# Patient Record
Sex: Male | Born: 1965
Health system: Southern US, Community
[De-identification: ages and names within clinical notes are randomized; demographics above are authoritative.]

## PROBLEM LIST (undated history)

## (undated) DIAGNOSIS — K219 Gastro-esophageal reflux disease without esophagitis: Secondary | ICD-10-CM

## (undated) DIAGNOSIS — M1990 Unspecified osteoarthritis, unspecified site: Secondary | ICD-10-CM

## (undated) DIAGNOSIS — I10 Essential (primary) hypertension: Secondary | ICD-10-CM

## (undated) DIAGNOSIS — F419 Anxiety disorder, unspecified: Secondary | ICD-10-CM

## (undated) DIAGNOSIS — E039 Hypothyroidism, unspecified: Secondary | ICD-10-CM

## (undated) DIAGNOSIS — F319 Bipolar disorder, unspecified: Secondary | ICD-10-CM

## (undated) DIAGNOSIS — Z87442 Personal history of urinary calculi: Secondary | ICD-10-CM

## (undated) DIAGNOSIS — E7800 Pure hypercholesterolemia, unspecified: Secondary | ICD-10-CM

## (undated) HISTORY — PX: ROTATOR CUFF REPAIR: SHX139

## (undated) HISTORY — PX: ANTERIOR CERVICAL DECOMP/DISCECTOMY FUSION: SHX1161

## (undated) HISTORY — PX: TRIGGER FINGER RELEASE: SHX641

## (undated) HISTORY — PX: EYE SURGERY: SHX253

---

## 2000-07-21 ENCOUNTER — Inpatient Hospital Stay (HOSPITAL_COMMUNITY): Admission: EM | Admit: 2000-07-21 | Discharge: 2000-07-26 | Payer: Self-pay | Admitting: Psychiatry

## 2000-09-24 ENCOUNTER — Encounter: Payer: Self-pay | Admitting: Urology

## 2000-09-24 ENCOUNTER — Ambulatory Visit (HOSPITAL_COMMUNITY): Admission: RE | Admit: 2000-09-24 | Discharge: 2000-09-24 | Payer: Self-pay | Admitting: Urology

## 2000-09-29 ENCOUNTER — Encounter: Payer: Self-pay | Admitting: Urology

## 2000-09-29 ENCOUNTER — Ambulatory Visit (HOSPITAL_COMMUNITY): Admission: RE | Admit: 2000-09-29 | Discharge: 2000-09-29 | Payer: Self-pay | Admitting: Urology

## 2004-02-01 ENCOUNTER — Inpatient Hospital Stay (HOSPITAL_COMMUNITY): Admission: EM | Admit: 2004-02-01 | Discharge: 2004-02-10 | Payer: Self-pay | Admitting: Psychiatry

## 2004-02-01 ENCOUNTER — Ambulatory Visit: Payer: Self-pay | Admitting: Psychiatry

## 2004-03-23 ENCOUNTER — Ambulatory Visit: Payer: Self-pay | Admitting: Psychiatry

## 2004-03-23 ENCOUNTER — Inpatient Hospital Stay (HOSPITAL_COMMUNITY): Admission: RE | Admit: 2004-03-23 | Discharge: 2004-04-02 | Payer: Self-pay | Admitting: Psychiatry

## 2004-03-23 ENCOUNTER — Encounter: Payer: Self-pay | Admitting: Emergency Medicine

## 2004-07-02 ENCOUNTER — Ambulatory Visit: Payer: Self-pay | Admitting: Psychiatry

## 2004-07-02 ENCOUNTER — Inpatient Hospital Stay (HOSPITAL_COMMUNITY): Admission: RE | Admit: 2004-07-02 | Discharge: 2004-07-09 | Payer: Self-pay | Admitting: Psychiatry

## 2004-10-27 ENCOUNTER — Emergency Department (HOSPITAL_COMMUNITY): Admission: EM | Admit: 2004-10-27 | Discharge: 2004-10-27 | Payer: Self-pay | Admitting: Emergency Medicine

## 2007-09-23 ENCOUNTER — Ambulatory Visit (HOSPITAL_COMMUNITY): Admission: RE | Admit: 2007-09-23 | Discharge: 2007-09-24 | Payer: Self-pay | Admitting: Neurosurgery

## 2008-02-03 ENCOUNTER — Ambulatory Visit (HOSPITAL_COMMUNITY): Admission: RE | Admit: 2008-02-03 | Discharge: 2008-02-03 | Payer: Self-pay | Admitting: Neurosurgery

## 2008-04-29 ENCOUNTER — Ambulatory Visit (HOSPITAL_COMMUNITY): Admission: RE | Admit: 2008-04-29 | Discharge: 2008-04-29 | Payer: Self-pay | Admitting: Neurosurgery

## 2008-06-28 ENCOUNTER — Inpatient Hospital Stay (HOSPITAL_COMMUNITY): Admission: RE | Admit: 2008-06-28 | Discharge: 2008-06-30 | Payer: Self-pay | Admitting: Neurosurgery

## 2010-06-27 LAB — BASIC METABOLIC PANEL
CO2: 27 mEq/L (ref 19–32)
Calcium: 9.7 mg/dL (ref 8.4–10.5)
Creatinine, Ser: 0.83 mg/dL (ref 0.4–1.5)
GFR calc Af Amer: >60 mL/min (ref >60)
GFR calc non Af Amer: >60 mL/min (ref >60)
Glucose, Bld: 99 mg/dL (ref 70–99)
Sodium: 137 mEq/L (ref 135–145)

## 2010-06-27 LAB — CBC
Hemoglobin: 14.9 g/dL (ref 13.0–17.0)
MCHC: 34.5 g/dL (ref 30.0–36.0)
RDW: 13.6 % (ref 11.5–15.5)

## 2010-06-27 LAB — NICOTINE/COTININE METABOLITES

## 2010-07-31 NOTE — Op Note (Signed)
NAME:  Joshua Barr, Joshua Barr             ACCOUNT NO.:  [id number]   MEDICAL RECORD NO.:  [id number]          PATIENT TYPE:  OIB   LOCATION:  3536                         FACILITY:  [facility]   PHYSICIAN:  Hilda Lias, M.D.   DATE OF BIRTH:  [birth year]   DATE OF PROCEDURE:  09/23/2007  DATE OF DISCHARGE:                               OPERATIVE REPORT   PREOPERATIVE DIAGNOSIS:  C6-C7 herniated disk with acute radiculopathy,  left.   POSTOPERATIVE DIAGNOSIS:  C6-C7 herniated disk with acute radiculopathy,  left.   PROCEDURE:  Anterior C6-C7 diskectomy, decompression of the spinal cord,  removal of large fragment in the left side, interbody fusion with  allograft and autograft plate, and microscopy.   SURGEON:  Hilda Lias, MD   ASSISTANT:  Coletta Memos   CLINICAL HISTORY:  The patient was seen by me a few days ago in my  office because of neck pain with radiation down to the left upper  extremity associated with weakness.  MRI showed a large herniated disk  at the level of C6-C7.  He failed with conservative treatment.  Surgery  was advised.   COURSE IN THE HOSPITAL:  The patient was taken to the OR, and after  intubation, the left side of the neck was cleaned with DuraPrep.  Transverse incision was made through the skin, subcutaneous tissue, and  platysma down to the cervical spine.  X-rays showed that we were at the  level of C5-C6.  From then on, we dropped one step down below, and with  the microscope, we opened the anterior ligament.  The C6-C7 disk was  removed.  Immediately, we opened the posterior ligament, and to the  right side, there was some herniated disk, but to the left side, we  found there were around 3-4 fragments compromising the C7 nerve root.  At the end, we had a good decompression of the spinal cord as well as  both C7 nerve roots.  Pieces of bone from the C6-C7 vertebral wall were  used for autograft.  Then, an allograft of 8 mm with autograft in the  middle  was inserted followed by a plate using 4 screws.  Because of the  shoulders, we were able to see the upper part of the screws, which were  in good position.  We waited 5 minutes just to be sure that we achieved  good hemostasis, and once this was done, the wound was closed with  Vicryl and Steri-Strips.           ______________________________  Hilda Lias, M.D.     EB/MEDQ  D:  09/23/2007  T:  09/24/2007  Job:  [id number]
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
## 2010-07-31 NOTE — Op Note (Signed)
NAME:  Joshua Barr, Joshua Barr             ACCOUNT NO.:  [id number]   MEDICAL RECORD NO.:  [id number]          PATIENT TYPE:  OIB   LOCATION:  3005                         FACILITY:  [facility]   PHYSICIAN:  Hilda Lias, M.D.   DATE OF BIRTH:  January 24, 1966   DATE OF PROCEDURE:  06/28/2008  DATE OF DISCHARGE:                               OPERATIVE REPORT   PREOPERATIVE DIAGNOSIS:  C6-C7 pseudoarthrosis.   POSTOPERATIVE DIAGNOSIS:  C6-C7 pseudoarthrosis.   PROCEDURE:  Removal of the previous C6-C7 plate, removal of the  pseudoarthrosis C6-C7, iliac crest bone graft, left interbody fusion  with the iliac crest bone graft, new plate, microscope.   SURGEON:  Hilda Lias, MD   ASSISTANT:  Clydene Fake, MD   CLINICAL HISTORY:  [name] is a gentleman who has had lateral  diverge to the disk at the C6-C7 several months ago.  Pain-wise, he did  well but then he developed neck pain which was worse with activity.  He  failed with the Pain Clinic.  X-rays showed that he has developed  pseudoarthrosis at C6-C7.  I talked to him and his family and at the end  he agreed to stop smoking before we went ahead and proceed with a new  fusion.  The risks were explained to him in the history and physical.   PROCEDURE IN DETAIL:  The patient was taken to the OR.  After  intubation, the neck on the left side was cleaned with DuraPrep.  Transverse incision following the previous wound was made through the  subcutaneous tissue, platysma down into the cervical area.  The patient  had quite a bit of adhesion and lysis was accomplished.  At the end, we  were able to see the plate.  Scar tissue was removed.  The plate was  removed easily.  Then with the microscope and the help of the  microcurette as well as the drill, we removed the osteoarthrosis in  between C6-C7 until we were able to find a normal vertebral body of C6-  C7.  Because he has no pain in the arms, foraminotomy was attemped.  There was not  anything compromising the nerve root.  Then an incision on  the left hip through the skin and subcutaneous tissue toward the thick  adipose tissue down to the fascia was done.  Then a piece of bone graft  of approximately 8 mm height and thin lens was removed.  After we  removed this, the hemostasis of the donor site was done with a Gelfoam.  The edges of the area were smoothed with the rongeur.  Then the wound  was closed with Vicryl and Steri-Strips.  At the neck, We were able to  tailor the bone graft and to fill out the cavity.  It was done without a  problem.  This was followed by a new plate using four screws.  We  attempted to do a left paracervical spine but because of the shoulder we  were only to see only the Doppler  C5.  The area was removed.  The area  was irrigated and although  we achieved good hemostasis and the drain was  left.  Then the wound was closed with Vicryl and Steri-Strips.           ______________________________  Hilda Lias, M.D.    EB/MEDQ  D:  06/28/2008  T:  06/29/2008  Job:  [id number]
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
## 2010-08-03 NOTE — H&P (Signed)
[facility]  Patient:    Joshua Barr, Joshua Barr                      MRN: [id number] Adm. Date:  [id number] Attending:  Rachael Fee Dictator:   Young Berry Lorin Picket, R.N., F.N.P.                   Psychiatric Admission Assessment  DATE OF ADMISSION:  Jul 21, 2000  PATIENT IDENTIFICATION:  This is a 45 year old separated Caucasian male voluntary admission for suicidal ideation with thoughts of shooting himself but no plan.  HISTORY OF PRESENT ILLNESS:  Today, the patient reports increased depression for approximately two months since his wife left him.  She walked out on him after six months of marriage and he has had no communication with her since that time.  She left him with a fair amount of bills and some financial stress. The patient cites symptoms of increased sadness and loneliness, feels worthless and a failure.  He is having difficulty concentrating on the job but states that his performance is okay.  States he becomes more depressed being home alone in an empty house.  He denies any suicidal ideation today.  He denies that he had ever had a plan or any real intent to harm himself but was having recurrent thoughts over the past couple of months that he would be better off dead.  He reports that he does own a gun but had not followed through with any kinds of plans to harm himself.  Although he does have suicidal ideation today, he continues to feel extremely depressed.  He denies homicidal ideation.  He does admit to having some auditory hallucinations over the past couple of weeks, thinking that he hears somebody call his name but when he turns, there is no one there.  He is not having any auditory hallucinations today.  He has had no visual hallucinations, denies any paranoia.  The patient reports no change in appetite but has gained approximately 60 pounds in the last six months.  PAST PSYCHIATRIC HISTORY:  The patient has been followed by  [facility] for approximately three years.  Anice Paganini is his counselor there and Robin, the nurse practitioner, follows him for his prescription medications.  He was diagnosed with bipolar disorder approximately four to five years ago with a history when his mood is elevated he is alternately happy and then very irritable and angry, seems to have mood swings only more when his mood is elevated.  Inpatient history includes admission to [facility] approximately five years ago and to [facility] one time earlier for detoxifying off marijuana.  The patient previously has been treated with Depakote, Trileptal, Paxil, and lithium with poor results.  He felt that he became resistant to lithium and feels that Trileptal and Depakote just did not work very well for him in the past.  SUBSTANCE ABUSE HISTORY:  The patient reports drinking three beers every one to two days and marijuana once every two to three days.  Denies any use of cocaine or other illegal drugs.  PAST MEDICAL HISTORY:  The patient is followed by Dr. Olena Leatherwood, primary care Jeri Rawlins in [facility]; last seen two weeks ago for a sinus infection treated with Biaxin.  The patient also gives a history of GERD, which has been untreated, and a history of seasonal allergies for which he has taken Allegra intermittently in the past.  The patient also states  that his blood pressure has been elevated at Dr. Olena Leatherwood, which Dr. Olena Leatherwood has chosen not to treat but has attributed to anxiety.  Medications: Zyprexa 10 mg q.a.m. and 15 mg q.p.m., Prozac 20 mg p.o. q.d., trazodone 100 mg p.o. q.h.s., and Allegra p.r.n. for allergies.  Drug allergies: No known drug allergies.  Please see physical examination done in the [facility] Emergency Room, which we will request a copy of.  It was noted that his UDS was positive for cannabinoids but otherwise negative.  Vital signs on admission were temperature 98.1, pulse  80, respirations 20, blood pressure 159/95 sitting and 153/100 standing.  The patient states that his baseline weight is 165 pounds. He is 5 feet 10.5 inches tall and weighs approximately 229 pounds.  Other labs showed BUN 10 and creatinine 1.0.  We will get a complete CBC and routine UA.   SOCIAL HISTORY:  The patient was raised in [place], now lives in [place], [place].  He has his GED.  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  He has married x 3.  His last marriage was for six months and his wife left when she got her tax money and left him with the bills.  His mother and stepfather are supportive.  He is estranged from his father who has a history of alcohol abuse.  Family history is positive for father with a history of alcohol abuse and one uncle with bipolar illness.  MENTAL STATUS EXAMINATION:  Casually dressed white male with blunted affect, neatly groomed.  Pleasant, polite, and cooperative.  Speech is normal in pace and tone and is relevant.  Mood is depressed.  Thought processes are logical and coherent.  Negative for suicidal ideation today although he has had suicidal thoughts all week intermittently; no evidence of homicidal ideation, no auditory or visual hallucinations at this time, no evidence of paranoia or delusions.  Cognitive: Oriented x 3 and intact.  ADMISSION DIAGNOSES: Axis I:    Bipolar disorder currently depressed with psychotic features. Axis II:   Deferred. Axis III:  1. Gastroesophageal reflux disease by history.            2. Seasonal allergies.            3. Elevated blood pressure. Axis IV:   Moderate problems with the primary support group with change in            marital status. Axis V:    Current is 35, past year is 56.  INITIAL PLAN OF CARE:  Admit the patient to stabilize his mood.  Q.10m. checks are in place.  We will get the physical examination and the emergency department records from [facility].  Problem 1.  Bipolar disorder, depressed.  We will plan on discontinuing his Zyprexa and we will start him on Seroquel 50 mg p.o. q.h.s.  We will complete his routine labs and then as soon as we are satisfied, will start him on  Lithobid 300 mg b.i.d. and we will check his level again on Thursday, May 9. We are going to discontinue his trazodone and we will give him Ambien 10 mg p.r.n. for sleep.  We will also provide him with Ativan 0.5 mg p.r.n. for anxiety.  We are going to discontinue his Prozac and we will start him on Zoloft 50 mg a day; however a starter dose of Zoloft 25 mg at lunch time today since he has already had his Prozac.  Problem 2. Elevated  blood pressure.  This is possibly from anxiety and we will continue to monitor it for now on a b.i.d. basis.  Problem 3. Seasonal allergies.  We will start him on Allegra 60 mg p.o. q.d. p.r.n.  ESTIMATED LENGTH OF STAY:  Three to five days. DD:  07/22/00 TD:  07/22/00 Job: [id number]

## 2010-08-03 NOTE — H&P (Signed)
NAME:  Joshua Barr, Joshua Barr NO.:  [id number]   MEDICAL RECORD NO.:  [id number]          PATIENT TYPE:  IPS   LOCATION:  0505                          [facility]ACILITY[facility]  [facility]   PHYSICIAN:  Jeanice Lim, M.D. DATE OF BIRTH:  01-26-1966   DATE OF ADMISSION:  03/23/2004  DATE OF DISCHARGE:                         PSYCHIATRIC ADMISSION ASSESSMENT   This is a 45 year old married white male.  Apparently he presented to the  [facility] emergency room last night reports suicidal and homicidal  ideation, mood swings and a lot of rage.  He was also complaining of right  pain.  He stomped his foot a month ago.  His urine drug screen was positive  for marijuana, cocaine, and benzodiazepines.  He is not prescribed any  benzodiazepines, according to our records and according to the information  he gave the intake person.  When asked about this, he stated that a Dr.  Olena Leatherwood up in [place] had prescribed him some alprazolam a few weeks and that  is why he had benzodiazepines in his urine.  Today, he states that he just  felt depressed and the urge to re-use cocaine became overwhelming.  He could  not resist it, and hence, he relapsed.   PAST PSYCHIATRIC HISTORY:  He was last here as an inpatient November 16 to  November 25.  He was hospitalized at [facility] in 2002.  He has  been to [facility].  He has had other rehab programs.  He is currently followed in  [facility] by Dr. Duayne Cal at [facility].   SOCIAL HISTORY:  He received a high school diploma through the mail.  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  He has been unemployed  the past 2 years.  He is married with no children.  He gets $25.00 a week  from his mother.  His wife is employed as a Comptroller.  She sits with old  people.   FAMILY HISTORY:  He states he has 2 cousins and an uncle who drink alcohol  and are bipolar.   ALCOHOL AND DRUG HISTORY:  He started using at age 47.  He began smoking  marijuana at age 16.  He states his alcohol use began at age 23, and  cocaine, he is unsure, but he uses several time a week.   MEDICAL HISTORY:  Primary care physician is Dr. Olena Leatherwood in [place].  He is  followed for hypothyroidism and osteoarthritis.   MEDICATIONS:  He states that he was prescribed:  1.  Trazodone 250 mg a night.  2.  Tegretol 4 a day, the milligram size is unknown; it is probably 200.  3.  Neurontin, again ? on the milligrams, 6 a day.  4.  Risperdal 0.5 mg 7 per day.  5.  Levothyroxine 75 mcg per day.  6.  Protonix 100 mg one per day.  7.  Mobic 10 mg one per day.   ALLERGIES:  NO KNOWN DRUG ALLERGIES.   PHYSICAL EXAMINATION:  Unremarkable.  He was cleared through the emergency  room.   MENTAL STATUS EXAMINATION:  He  is alert and oriented x3.  He is  appropriately groomed and dressed.  His speech is not pressured.  His motor  and gait are normal.  He has good eye contact.  Mood: He says he feels  worthless and paranoid.  His affect is appropriate.  Thought processes are  clear, rational and goal-oriented.  He states he wants to get better.  There  were no delusions noted.  He states he is paranoid.  He states he feels  fearful of others.  This was not observed.  Judgment and insight are fair.  Concentration and memory are intact.  Intelligence is at least average.  Today, he states he is not feeling suicidal or homicidal.  However, he  acknowledges that he still hears mumbling and hears his name being called at  times.   DIAGNOSES:   AXIS I:  1.  Polysubstance abuse.  2.  Substance abuse induced mood disorder, bipolar type with psychotic      features.  3.  Auditory hallucinations.   AXIS II:  Rule out narcissistic personality disorder.   AXIS III:  1.  Gastroesophageal reflux disease.  2.  Hypothyroidism.  3.  Osteoarthritis.   AXIS IV:  Economic problem, housing problem, problems related to the legal  system.  He had charges brought against him  regarding possession of cocaine.  They will end in a few months if he is clean.  Needless to say, he has not  been clean.   AXIS V:  Global assessment of functioning 30.   PLAN:  Admit for safety and stabilization, to adjust his medications as  indicated, and to review his association with NA and make sure he has a  sponsor.      MD/MEDQ  D:  03/24/2004  T:  03/24/2004  Job:  [id number]

## 2010-08-03 NOTE — Discharge Summary (Signed)
[facility]  Patient:    Joshua Barr, Joshua Barr                    MRN: [id number] Adm. Date:  [id number] Disc. Date: [id number] Attending:  Denny Peon Dictator:   Landry Corporal, N.P.                           Discharge Summary  The patient was denying any auditory hallucinations or visual hallucinations, denying any paranoia.  He does report some change in his appetite to where he has gained approximately 60 pounds in the last 6 months.  Patient is followed by [facility] for approximately last 3 years.  He sees Otilio Miu, Nurse Practitioner, and Anice Paganini as his counselor.  Patient was diagnosed with bipolar disorder approximately 4 to 5 years ago.  PAST MEDICAL HISTORY:  The patient is followed by Dr. Gaspar Skeeters, primary care [name], [facility].  He was last seen a couple of weeks ago for a sinus infection.  The patient also has a history of GERD, and a history of seasonal allergies which he takes Allegra for. DD:  08/15/00 TD:  08/15/00 Job: [id number]

## 2010-08-03 NOTE — Discharge Summary (Signed)
[facility]  Patient:    Joshua Barr, Joshua Barr                    MRN: [id number] Adm. Date:  [id number] Disc. Date: [id number] Attending:  Denny Peon Dictator:   Candi Leash. Orsini, N.P.                           Discharge Summary  HISTORY OF PRESENT ILLNESS:  This is a 45 year old separated Caucasian male that was voluntarily admitted for suicidal ideation with thoughts of shooting himself with no intent.  The patient reported increased depression for approximately two months since his wife had left him.  The patient was also experiencing some financial stress.  He was having symptoms of depression, feeling very sad and lonely, worthless and a failure.  He was having difficulty concentrating on his job, although he felt his job performance was adequate. DD:  08/15/00 TD:  08/15/00 Job: [id number]
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
## 2010-08-03 NOTE — Discharge Summary (Signed)
[facility]  Patient:    Joshua Barr, Joshua Barr                    MRN: [id number] Adm. Date:  [id number] Disc. Date: [id number] Attending:  Denny Peon Dictator:   Landry Corporal, N.P.                           Discharge Summary  CURRENT MEDICATIONS: 1. Zyprexa 10 mg q.a.m. and 15 mg q.p.m. 2. Prozac 20 mg p.o. q.d. 3. Trazodone 100 mg p.o. q.h.s. 4. Allegra p.r.n. for allergies.  DRUG ALLERGIES:  No known drug allergies.  PHYSICAL EXAMINATION:  Performed at emergency room.  Urine drug screen was positive for cannabis.  MENTAL STATUS EXAMINATION:  He is a casually dressed white male with blunted affect.  Neatly groomed.  Pleasant, polite, and cooperative.  Speech is normal in pace and tone, and is relevant.  Mood is depressed.  Thought processes are logical and coherent. Negative for suicidal ideation, although he has had suicidal thoughts all week intermittently with no evidence of homicidal ideation.  No auditory or visual hallucinations or evidence of paranoia or delusions.  Cognitively, he is oriented x 3 and is intact.  ADMITTING DIAGNOSIS: Axis I:     Bipolar disorder, currently depressed, with psychotic features. Axis II:    Deferred. Axis III:   1. Gastroesophageal reflux disease by history.             2. Seasonal allergies.             3. Elevated blood pressure. Axis IV:    Moderate problems relating to primary support group, with change             in marital status. Axis V:     Current is 30, this past year 65.  HOSPITAL COURSE:  The patient was admitted to stabilize his mood.  He will be monitored every 15 minutes.  We will discontinue his Zyprexa and start him on Seroquel q.h.s.  Will complete his routine labs.  Will also discontinue his trazodone and start him on Ambien 10 mg for sleep, and have Ativan available on a p.r.n. basis for anxiety.  We will monitor his blood pressure and continue him on his Allegra for his seasonal  allergies.  Patient initially was doing well.  He was improving.  He was sleeping better, without having any hallucinations.  He was having a decrease in racing thoughts.  He was tolerating his medications well. We will continue him on his lithium and look at a short hospital stay.  Patient, however, had some return of thoughts or voices  and recurring anxiety and anger.  He was sleeping little and continuing with his racing thoughts.  His affect remained blunted.  We increased his Risperdal and increased his Zoloft.  He was still having some problems with sleep.  We decreased his trazodone and increased his Seroquel.  CONDITION ON DISCHARGE:  The client was much better.  He was calmer and there were no signs of psychosis.  He was promising safety.  He was no longer discussing any suicidal ideation.  There appeared to be good support with family.  It was felt that patient could be managed on outpatient basis.  DISPOSITION:  Patient was discharged to home.  FOLLOW UP:  He was to follow up with [facility].  An appointment and phone number was provided.  He was  to see Brock Bad.  DISCHARGE MEDICATIONS: 1. Zoloft 50 mg 1-1/2 tabs every day, 75 mg total. 2. Risperdal 0.25 mg one at 8, 12, and 4. 3. Seroquel 25 mg 3 at bedtime. 4. Lithium carbonate 1 in the morning and 2 at night. 5. Trazodone 50 mg 1 at bedtime as needed for sleep. 6. Claritin 10 mg 1 q.d. for allergies as needed.  DISCHARGE DIAGNOSES: Axis I:    Bipolar disorder, currently depressed, with psychotic features. Axis II:   Deferred. Axis III:  1. Gastroesophageal reflux disease.            2. Seasonal allergies.            3. Elevated blood pressure. Axis IV:   Moderate psychosocial stressors. Axis V:    Current is 73, this past year 39. DD:  08/15/00 TD:  08/15/00 Job: [id number]
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
## 2010-11-04 IMAGING — CT CT CERVICAL SPINE W/ CM
3 of 6 series · 9 of 30 positions shown, 10 images · non-contrast
Comparison: None.
COMPARISON: None.

CLINICAL DATA: Left arm and left hand numbness and tingling.

MYELOGRAM CERVICAL
TECHNIQUE: Contrast was injected by Dr.Fluker at the at what
appears to be the L4-5  level.  Contrast was negotiated into the
cervical spine without difficulty.
TECHNIQUE: Multidetector CT imaging of the cervical spine was
performed following myelography.  Multiplanar CT image
reconstructions were also generated.

[Series 2: cervical spine · axial · 0.27mm/px · z∈[-282,-175]mm · 3 of 87 slices shown, 4 images]
[im 22/87  soft-tissue]
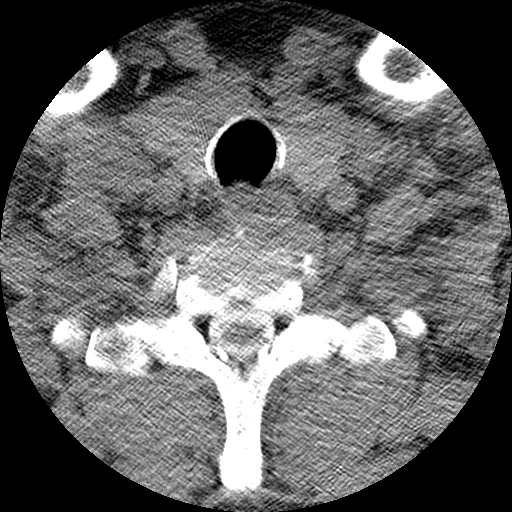
[im 22/87  bone]
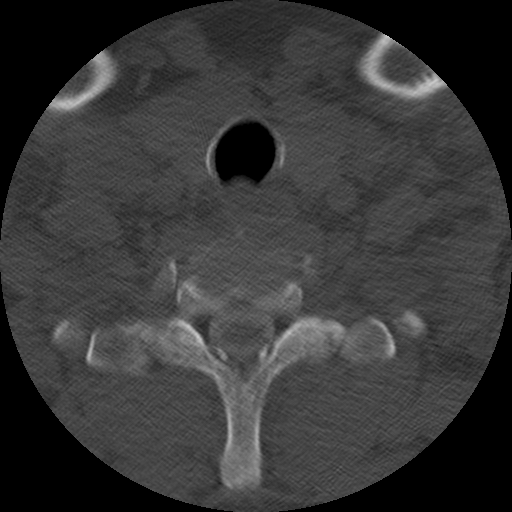
[im 44/87  bone]
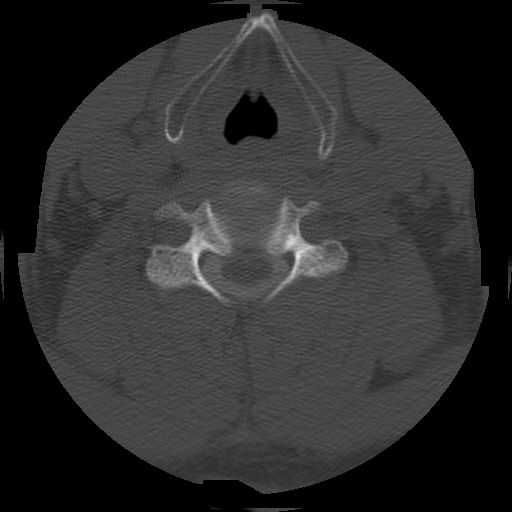
[im 65/87  bone]
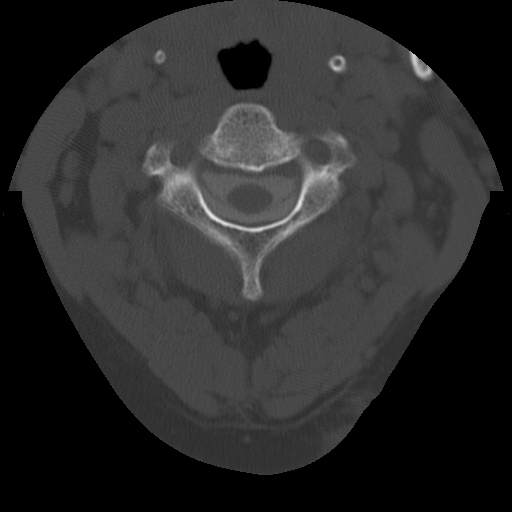

[Series 3: recon 2: cervical spine · axial · 0.25mm/px · z∈[-278,-170]mm · 3 of 85 slices shown]
[im 22/85  bone]
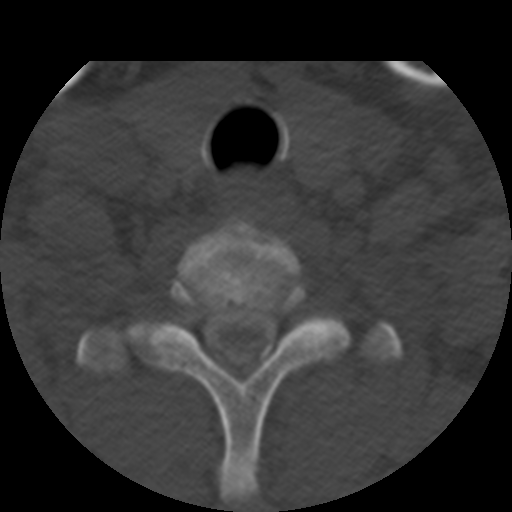
[im 43/85  bone]
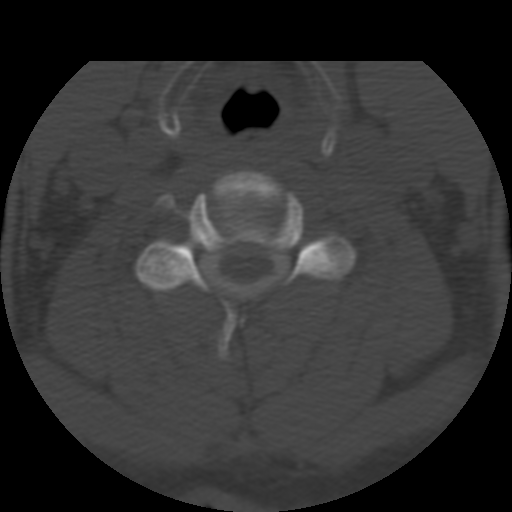
[im 64/85  bone]
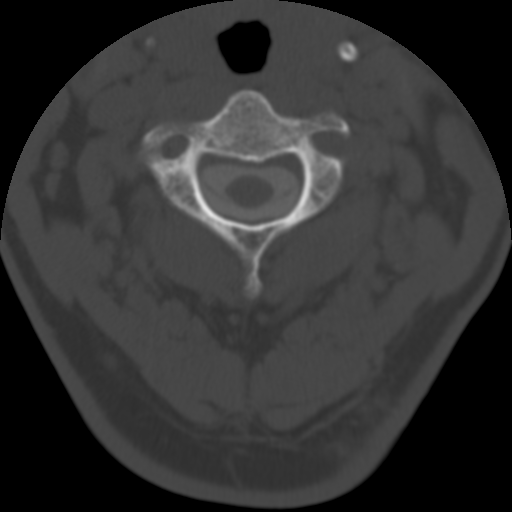

[Series 104: coronal cspine- detail · coronal · 0.43mm/px · 3 of 55 slices shown]
[im 10/55  bone]
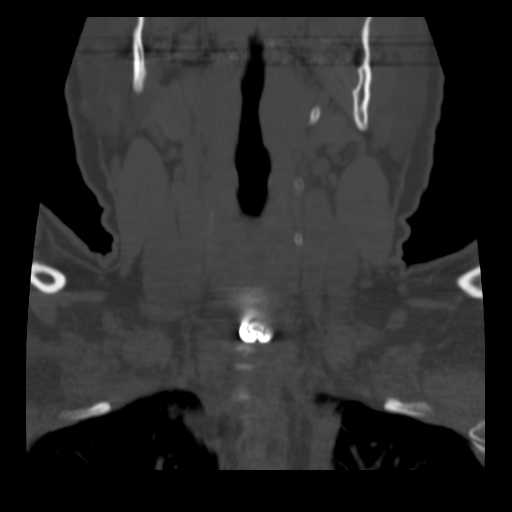
[im 19/55  bone]
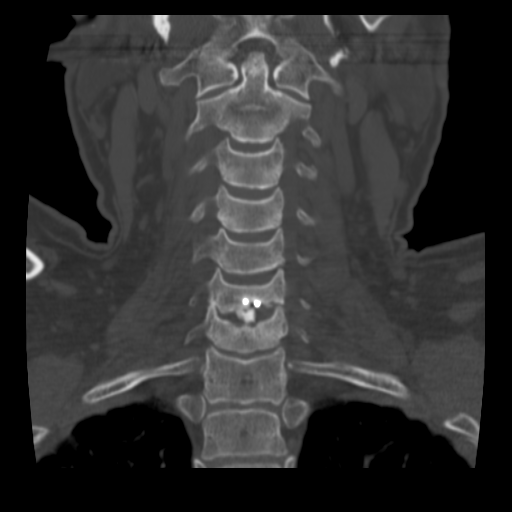
[im 28/55  bone]
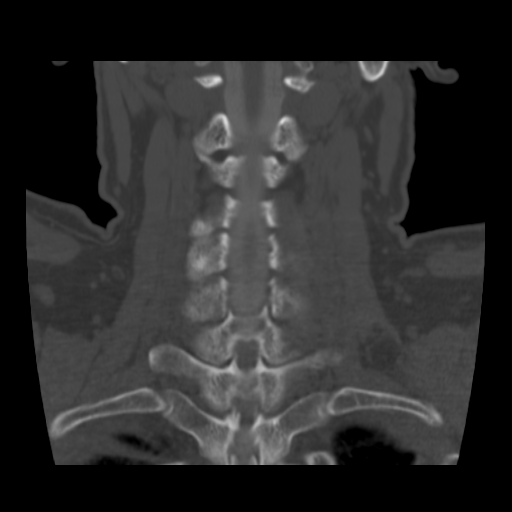

[9 of 30 positions shown; findings below may reference images not displayed]

FINDINGS: With the neck in an extended position, there is mild to
moderate spinal stenosis at the C2-3 level.  Mild ventral
impression upon the thecal sac C3-4, C4-5 and C5-6 level.
Incomplete evaluation of the C6-7 fused level secondary to
shoulders, anterior plate and screws.
IMPRESSION: Spinal stenosis appears most prominent at the C2-3 level.  Please
see below.

CT MYELOGRAPHY CERVICAL SPIN
FINDINGS: The present examination incorporates from the foramen
magnum to the upper T3 level. Cervical medullary junction
unremarkable.  Visualized lung apices clear.

C2-3: Minimal bulge slightly greater towards left. Small spur left
paracentral position superior aspect C3 with mild indentation upon
the thecal sac.

C3-4: Minimal bulge.  Minimal uncinate hypertrophy slightly greater
on the right

C4-5: Mild kyphosis centered at the level.  Bulge with small
osteophyte posterior lateral position causes mild spinal stenosis
and minimal cord flattening.  Minimal bilateral foraminal
narrowing. Small spur posterior central aspect superior C5.

C5-6: Mild bulge.  Mild spinal stenosis.

C6-7: Prior fusion.  No bony incorporation across the disc space.
Anterior plate and screws in place.  Broad-based spur above and
below the disc space.  Bulge.  Mild spinal stenosis.  Mild left
facet joint degenerative changes.  Mild foraminal narrowing
bilaterally.

C7-T1: Mild facet joint degenerative changes left greater right.
Minimal foraminal narrowing.  No significant spinal stenosis.
IMPRESSION: Prior fusion C6-7 level with incomplete bony incorporation across
the disc space.   Broad-based bony spur above and below the disc
space, mild bulge and mild spinal stenosis.

C4-5 bulge with small osteophyte posterior lateral position causes
mild spinal stenosis and minimal cord flattening.

C5-6 mild bulge and mild spinal stenosis.  Please see above further
detail.

The spinal stenosis at the C2-3 level appreciated on the cross-
table lateral view obtained during myelography is much less
apparent on the present exam when the patient is in a prone
position.

## 2010-12-13 LAB — BASIC METABOLIC PANEL
BUN: 15
Calcium: 9.9
Chloride: 102
Creatinine, Ser: 0.87
GFR calc Af Amer: >60
GFR calc non Af Amer: >60

## 2010-12-13 LAB — CBC
Platelets: 283
RBC: 4.98
WBC: 7.7

## 2014-07-13 DIAGNOSIS — S3992XA Unspecified injury of lower back, initial encounter: Secondary | ICD-10-CM | POA: Diagnosis not present

## 2014-07-13 DIAGNOSIS — Z79899 Other long term (current) drug therapy: Secondary | ICD-10-CM | POA: Diagnosis not present

## 2014-07-13 DIAGNOSIS — Z9889 Other specified postprocedural states: Secondary | ICD-10-CM | POA: Diagnosis not present

## 2014-07-13 DIAGNOSIS — M545 Low back pain: Secondary | ICD-10-CM | POA: Diagnosis not present

## 2014-07-13 DIAGNOSIS — S199XXA Unspecified injury of neck, initial encounter: Secondary | ICD-10-CM | POA: Diagnosis not present

## 2014-07-13 DIAGNOSIS — Y92410 Unspecified street and highway as the place of occurrence of the external cause: Secondary | ICD-10-CM | POA: Diagnosis not present

## 2014-07-13 DIAGNOSIS — S0990XA Unspecified injury of head, initial encounter: Secondary | ICD-10-CM | POA: Diagnosis not present

## 2014-07-13 DIAGNOSIS — S161XXA Strain of muscle, fascia and tendon at neck level, initial encounter: Secondary | ICD-10-CM | POA: Diagnosis not present

## 2014-07-13 DIAGNOSIS — M542 Cervicalgia: Secondary | ICD-10-CM | POA: Diagnosis not present

## 2014-07-13 DIAGNOSIS — S39012A Strain of muscle, fascia and tendon of lower back, initial encounter: Secondary | ICD-10-CM | POA: Diagnosis not present

## 2014-07-20 DIAGNOSIS — Z136 Encounter for screening for cardiovascular disorders: Secondary | ICD-10-CM | POA: Diagnosis not present

## 2014-07-20 DIAGNOSIS — Z1329 Encounter for screening for other suspected endocrine disorder: Secondary | ICD-10-CM | POA: Diagnosis not present

## 2014-07-20 DIAGNOSIS — E039 Hypothyroidism, unspecified: Secondary | ICD-10-CM | POA: Diagnosis not present

## 2014-07-20 DIAGNOSIS — Z131 Encounter for screening for diabetes mellitus: Secondary | ICD-10-CM | POA: Diagnosis not present

## 2014-08-29 DIAGNOSIS — L309 Dermatitis, unspecified: Secondary | ICD-10-CM | POA: Diagnosis not present

## 2014-10-31 DIAGNOSIS — L309 Dermatitis, unspecified: Secondary | ICD-10-CM | POA: Diagnosis not present

## 2014-10-31 DIAGNOSIS — C44519 Basal cell carcinoma of skin of other part of trunk: Secondary | ICD-10-CM | POA: Diagnosis not present

## 2014-11-01 DIAGNOSIS — Z23 Encounter for immunization: Secondary | ICD-10-CM | POA: Diagnosis not present

## 2014-11-08 DIAGNOSIS — C4441 Basal cell carcinoma of skin of scalp and neck: Secondary | ICD-10-CM | POA: Diagnosis not present

## 2014-12-10 DIAGNOSIS — N410 Acute prostatitis: Secondary | ICD-10-CM | POA: Diagnosis not present

## 2014-12-10 DIAGNOSIS — M545 Low back pain: Secondary | ICD-10-CM | POA: Diagnosis not present

## 2014-12-10 DIAGNOSIS — Z85828 Personal history of other malignant neoplasm of skin: Secondary | ICD-10-CM | POA: Diagnosis not present

## 2014-12-10 DIAGNOSIS — Z79899 Other long term (current) drug therapy: Secondary | ICD-10-CM | POA: Diagnosis not present

## 2014-12-10 DIAGNOSIS — N200 Calculus of kidney: Secondary | ICD-10-CM | POA: Diagnosis not present

## 2014-12-10 DIAGNOSIS — Z8042 Family history of malignant neoplasm of prostate: Secondary | ICD-10-CM | POA: Diagnosis not present

## 2014-12-12 DIAGNOSIS — N410 Acute prostatitis: Secondary | ICD-10-CM | POA: Diagnosis not present

## 2014-12-14 DIAGNOSIS — Z125 Encounter for screening for malignant neoplasm of prostate: Secondary | ICD-10-CM | POA: Diagnosis not present

## 2014-12-30 DIAGNOSIS — J3089 Other allergic rhinitis: Secondary | ICD-10-CM | POA: Diagnosis not present

## 2015-02-28 DIAGNOSIS — J3089 Other allergic rhinitis: Secondary | ICD-10-CM | POA: Diagnosis not present

## 2015-02-28 DIAGNOSIS — Z0000 Encounter for general adult medical examination without abnormal findings: Secondary | ICD-10-CM | POA: Diagnosis not present

## 2015-02-28 DIAGNOSIS — M545 Low back pain: Secondary | ICD-10-CM | POA: Diagnosis not present

## 2015-05-01 DIAGNOSIS — J3089 Other allergic rhinitis: Secondary | ICD-10-CM | POA: Diagnosis not present

## 2015-05-01 DIAGNOSIS — Z79899 Other long term (current) drug therapy: Secondary | ICD-10-CM | POA: Diagnosis not present

## 2015-05-01 DIAGNOSIS — M545 Low back pain: Secondary | ICD-10-CM | POA: Diagnosis not present

## 2015-06-29 DIAGNOSIS — J3089 Other allergic rhinitis: Secondary | ICD-10-CM | POA: Diagnosis not present

## 2015-06-29 DIAGNOSIS — M545 Low back pain: Secondary | ICD-10-CM | POA: Diagnosis not present

## 2015-08-25 DIAGNOSIS — M545 Low back pain: Secondary | ICD-10-CM | POA: Diagnosis not present

## 2015-08-25 DIAGNOSIS — J3089 Other allergic rhinitis: Secondary | ICD-10-CM | POA: Diagnosis not present

## 2015-08-25 DIAGNOSIS — I10 Essential (primary) hypertension: Secondary | ICD-10-CM | POA: Diagnosis not present

## 2015-08-25 DIAGNOSIS — E668 Other obesity: Secondary | ICD-10-CM | POA: Diagnosis not present

## 2015-09-04 DIAGNOSIS — M545 Low back pain: Secondary | ICD-10-CM | POA: Diagnosis not present

## 2015-09-04 DIAGNOSIS — Z79899 Other long term (current) drug therapy: Secondary | ICD-10-CM | POA: Diagnosis not present

## 2015-09-04 DIAGNOSIS — F319 Bipolar disorder, unspecified: Secondary | ICD-10-CM | POA: Diagnosis not present

## 2015-09-04 DIAGNOSIS — E7800 Pure hypercholesterolemia, unspecified: Secondary | ICD-10-CM | POA: Diagnosis not present

## 2015-09-04 DIAGNOSIS — M5416 Radiculopathy, lumbar region: Secondary | ICD-10-CM | POA: Diagnosis not present

## 2015-09-04 DIAGNOSIS — E039 Hypothyroidism, unspecified: Secondary | ICD-10-CM | POA: Diagnosis not present

## 2015-09-05 DIAGNOSIS — M545 Low back pain: Secondary | ICD-10-CM | POA: Diagnosis not present

## 2015-09-05 DIAGNOSIS — M62830 Muscle spasm of back: Secondary | ICD-10-CM | POA: Diagnosis not present

## 2015-09-05 DIAGNOSIS — N200 Calculus of kidney: Secondary | ICD-10-CM | POA: Diagnosis not present

## 2015-09-11 DIAGNOSIS — M4317 Spondylolisthesis, lumbosacral region: Secondary | ICD-10-CM | POA: Diagnosis not present

## 2015-09-11 DIAGNOSIS — I700 Atherosclerosis of aorta: Secondary | ICD-10-CM | POA: Diagnosis not present

## 2015-09-11 DIAGNOSIS — M47816 Spondylosis without myelopathy or radiculopathy, lumbar region: Secondary | ICD-10-CM | POA: Diagnosis not present

## 2015-10-26 DIAGNOSIS — K210 Gastro-esophageal reflux disease with esophagitis: Secondary | ICD-10-CM | POA: Diagnosis not present

## 2015-10-26 DIAGNOSIS — M62830 Muscle spasm of back: Secondary | ICD-10-CM | POA: Diagnosis not present

## 2015-10-26 DIAGNOSIS — E782 Mixed hyperlipidemia: Secondary | ICD-10-CM | POA: Diagnosis not present

## 2015-10-26 DIAGNOSIS — F3289 Other specified depressive episodes: Secondary | ICD-10-CM | POA: Diagnosis not present

## 2015-10-26 DIAGNOSIS — E038 Other specified hypothyroidism: Secondary | ICD-10-CM | POA: Diagnosis not present

## 2015-11-23 DIAGNOSIS — Z23 Encounter for immunization: Secondary | ICD-10-CM | POA: Diagnosis not present

## 2016-01-05 DIAGNOSIS — K210 Gastro-esophageal reflux disease with esophagitis: Secondary | ICD-10-CM | POA: Diagnosis not present

## 2016-01-05 DIAGNOSIS — F319 Bipolar disorder, unspecified: Secondary | ICD-10-CM | POA: Diagnosis not present

## 2016-01-05 DIAGNOSIS — F3289 Other specified depressive episodes: Secondary | ICD-10-CM | POA: Diagnosis not present

## 2016-01-05 DIAGNOSIS — G250 Essential tremor: Secondary | ICD-10-CM | POA: Diagnosis not present

## 2016-01-05 DIAGNOSIS — M62830 Muscle spasm of back: Secondary | ICD-10-CM | POA: Diagnosis not present

## 2016-01-05 DIAGNOSIS — F3189 Other bipolar disorder: Secondary | ICD-10-CM | POA: Diagnosis not present

## 2016-01-23 DIAGNOSIS — M62830 Muscle spasm of back: Secondary | ICD-10-CM | POA: Diagnosis not present

## 2016-02-27 DIAGNOSIS — Z125 Encounter for screening for malignant neoplasm of prostate: Secondary | ICD-10-CM | POA: Diagnosis not present

## 2016-02-27 DIAGNOSIS — Z131 Encounter for screening for diabetes mellitus: Secondary | ICD-10-CM | POA: Diagnosis not present

## 2016-02-27 DIAGNOSIS — M722 Plantar fascial fibromatosis: Secondary | ICD-10-CM | POA: Diagnosis not present

## 2016-02-27 DIAGNOSIS — Z0000 Encounter for general adult medical examination without abnormal findings: Secondary | ICD-10-CM | POA: Diagnosis not present

## 2016-04-29 DIAGNOSIS — I10 Essential (primary) hypertension: Secondary | ICD-10-CM | POA: Diagnosis not present

## 2016-04-29 DIAGNOSIS — M722 Plantar fascial fibromatosis: Secondary | ICD-10-CM | POA: Diagnosis not present

## 2016-04-29 DIAGNOSIS — F3189 Other bipolar disorder: Secondary | ICD-10-CM | POA: Diagnosis not present

## 2016-04-29 DIAGNOSIS — F3289 Other specified depressive episodes: Secondary | ICD-10-CM | POA: Diagnosis not present

## 2016-04-29 DIAGNOSIS — K210 Gastro-esophageal reflux disease with esophagitis: Secondary | ICD-10-CM | POA: Diagnosis not present

## 2016-05-17 DIAGNOSIS — M25572 Pain in left ankle and joints of left foot: Secondary | ICD-10-CM | POA: Diagnosis not present

## 2016-06-28 DIAGNOSIS — I10 Essential (primary) hypertension: Secondary | ICD-10-CM | POA: Diagnosis not present

## 2016-06-28 DIAGNOSIS — M25572 Pain in left ankle and joints of left foot: Secondary | ICD-10-CM | POA: Diagnosis not present

## 2016-06-28 DIAGNOSIS — M545 Low back pain: Secondary | ICD-10-CM | POA: Diagnosis not present

## 2016-08-22 DIAGNOSIS — E7800 Pure hypercholesterolemia, unspecified: Secondary | ICD-10-CM | POA: Diagnosis not present

## 2016-08-22 DIAGNOSIS — F319 Bipolar disorder, unspecified: Secondary | ICD-10-CM | POA: Diagnosis not present

## 2016-08-22 DIAGNOSIS — Z1211 Encounter for screening for malignant neoplasm of colon: Secondary | ICD-10-CM | POA: Diagnosis not present

## 2016-08-22 DIAGNOSIS — D127 Benign neoplasm of rectosigmoid junction: Secondary | ICD-10-CM | POA: Diagnosis not present

## 2016-08-22 DIAGNOSIS — K219 Gastro-esophageal reflux disease without esophagitis: Secondary | ICD-10-CM | POA: Diagnosis not present

## 2016-08-22 DIAGNOSIS — K635 Polyp of colon: Secondary | ICD-10-CM | POA: Diagnosis not present

## 2016-08-22 DIAGNOSIS — Z87442 Personal history of urinary calculi: Secondary | ICD-10-CM | POA: Diagnosis not present

## 2016-08-22 DIAGNOSIS — E039 Hypothyroidism, unspecified: Secondary | ICD-10-CM | POA: Diagnosis not present

## 2016-08-22 DIAGNOSIS — M797 Fibromyalgia: Secondary | ICD-10-CM | POA: Diagnosis not present

## 2016-08-22 DIAGNOSIS — Z888 Allergy status to other drugs, medicaments and biological substances status: Secondary | ICD-10-CM | POA: Diagnosis not present

## 2016-08-22 DIAGNOSIS — G43909 Migraine, unspecified, not intractable, without status migrainosus: Secondary | ICD-10-CM | POA: Diagnosis not present

## 2016-08-22 DIAGNOSIS — M1990 Unspecified osteoarthritis, unspecified site: Secondary | ICD-10-CM | POA: Diagnosis not present

## 2016-08-22 DIAGNOSIS — I10 Essential (primary) hypertension: Secondary | ICD-10-CM | POA: Diagnosis not present

## 2016-08-27 DIAGNOSIS — K219 Gastro-esophageal reflux disease without esophagitis: Secondary | ICD-10-CM | POA: Diagnosis not present

## 2016-08-27 DIAGNOSIS — I10 Essential (primary) hypertension: Secondary | ICD-10-CM | POA: Diagnosis not present

## 2016-08-27 DIAGNOSIS — M25572 Pain in left ankle and joints of left foot: Secondary | ICD-10-CM | POA: Diagnosis not present

## 2016-08-27 DIAGNOSIS — M545 Low back pain: Secondary | ICD-10-CM | POA: Diagnosis not present

## 2016-08-27 DIAGNOSIS — F310 Bipolar disorder, current episode hypomanic: Secondary | ICD-10-CM | POA: Diagnosis not present

## 2016-08-27 DIAGNOSIS — J3089 Other allergic rhinitis: Secondary | ICD-10-CM | POA: Diagnosis not present

## 2016-09-16 DIAGNOSIS — C44699 Other specified malignant neoplasm of skin of left upper limb, including shoulder: Secondary | ICD-10-CM | POA: Diagnosis not present

## 2016-10-25 DIAGNOSIS — K210 Gastro-esophageal reflux disease with esophagitis: Secondary | ICD-10-CM | POA: Diagnosis not present

## 2016-10-25 DIAGNOSIS — M545 Low back pain: Secondary | ICD-10-CM | POA: Diagnosis not present

## 2016-10-25 DIAGNOSIS — J3089 Other allergic rhinitis: Secondary | ICD-10-CM | POA: Diagnosis not present

## 2016-10-25 DIAGNOSIS — F411 Generalized anxiety disorder: Secondary | ICD-10-CM | POA: Diagnosis not present

## 2016-11-07 DIAGNOSIS — Z23 Encounter for immunization: Secondary | ICD-10-CM | POA: Diagnosis not present

## 2017-01-24 DIAGNOSIS — I10 Essential (primary) hypertension: Secondary | ICD-10-CM | POA: Diagnosis not present

## 2017-01-24 DIAGNOSIS — J3089 Other allergic rhinitis: Secondary | ICD-10-CM | POA: Diagnosis not present

## 2017-01-24 DIAGNOSIS — M545 Low back pain: Secondary | ICD-10-CM | POA: Diagnosis not present

## 2017-01-24 DIAGNOSIS — F411 Generalized anxiety disorder: Secondary | ICD-10-CM | POA: Diagnosis not present

## 2017-01-24 DIAGNOSIS — K210 Gastro-esophageal reflux disease with esophagitis: Secondary | ICD-10-CM | POA: Diagnosis not present

## 2017-03-06 DIAGNOSIS — Z125 Encounter for screening for malignant neoplasm of prostate: Secondary | ICD-10-CM | POA: Diagnosis not present

## 2017-03-06 DIAGNOSIS — I10 Essential (primary) hypertension: Secondary | ICD-10-CM | POA: Diagnosis not present

## 2017-03-06 DIAGNOSIS — J3089 Other allergic rhinitis: Secondary | ICD-10-CM | POA: Diagnosis not present

## 2017-03-06 DIAGNOSIS — K645 Perianal venous thrombosis: Secondary | ICD-10-CM | POA: Diagnosis not present

## 2017-03-06 DIAGNOSIS — K210 Gastro-esophageal reflux disease with esophagitis: Secondary | ICD-10-CM | POA: Diagnosis not present

## 2017-03-06 DIAGNOSIS — F411 Generalized anxiety disorder: Secondary | ICD-10-CM | POA: Diagnosis not present

## 2017-04-24 DIAGNOSIS — K210 Gastro-esophageal reflux disease with esophagitis: Secondary | ICD-10-CM | POA: Diagnosis not present

## 2017-04-24 DIAGNOSIS — I10 Essential (primary) hypertension: Secondary | ICD-10-CM | POA: Diagnosis not present

## 2017-04-24 DIAGNOSIS — F411 Generalized anxiety disorder: Secondary | ICD-10-CM | POA: Diagnosis not present

## 2017-04-24 DIAGNOSIS — M545 Low back pain: Secondary | ICD-10-CM | POA: Diagnosis not present

## 2017-04-24 DIAGNOSIS — J3089 Other allergic rhinitis: Secondary | ICD-10-CM | POA: Diagnosis not present

## 2017-06-23 DIAGNOSIS — J3089 Other allergic rhinitis: Secondary | ICD-10-CM | POA: Diagnosis not present

## 2017-06-23 DIAGNOSIS — F3189 Other bipolar disorder: Secondary | ICD-10-CM | POA: Diagnosis not present

## 2017-06-23 DIAGNOSIS — Z1389 Encounter for screening for other disorder: Secondary | ICD-10-CM | POA: Diagnosis not present

## 2017-06-23 DIAGNOSIS — Z0000 Encounter for general adult medical examination without abnormal findings: Secondary | ICD-10-CM | POA: Diagnosis not present

## 2017-06-23 DIAGNOSIS — K210 Gastro-esophageal reflux disease with esophagitis: Secondary | ICD-10-CM | POA: Diagnosis not present

## 2017-06-23 DIAGNOSIS — M545 Low back pain: Secondary | ICD-10-CM | POA: Diagnosis not present

## 2017-06-23 DIAGNOSIS — I10 Essential (primary) hypertension: Secondary | ICD-10-CM | POA: Diagnosis not present

## 2017-07-03 DIAGNOSIS — E7800 Pure hypercholesterolemia, unspecified: Secondary | ICD-10-CM | POA: Diagnosis not present

## 2017-07-03 DIAGNOSIS — M797 Fibromyalgia: Secondary | ICD-10-CM | POA: Diagnosis not present

## 2017-07-03 DIAGNOSIS — Z7951 Long term (current) use of inhaled steroids: Secondary | ICD-10-CM | POA: Diagnosis not present

## 2017-07-03 DIAGNOSIS — M1990 Unspecified osteoarthritis, unspecified site: Secondary | ICD-10-CM | POA: Diagnosis not present

## 2017-07-03 DIAGNOSIS — Z87891 Personal history of nicotine dependence: Secondary | ICD-10-CM | POA: Diagnosis not present

## 2017-07-03 DIAGNOSIS — Z87442 Personal history of urinary calculi: Secondary | ICD-10-CM | POA: Diagnosis not present

## 2017-07-03 DIAGNOSIS — I10 Essential (primary) hypertension: Secondary | ICD-10-CM | POA: Diagnosis not present

## 2017-07-03 DIAGNOSIS — Z79899 Other long term (current) drug therapy: Secondary | ICD-10-CM | POA: Diagnosis not present

## 2017-07-03 DIAGNOSIS — F319 Bipolar disorder, unspecified: Secondary | ICD-10-CM | POA: Diagnosis not present

## 2017-07-03 DIAGNOSIS — M4317 Spondylolisthesis, lumbosacral region: Secondary | ICD-10-CM | POA: Diagnosis not present

## 2017-07-03 DIAGNOSIS — E039 Hypothyroidism, unspecified: Secondary | ICD-10-CM | POA: Diagnosis not present

## 2017-07-03 DIAGNOSIS — K219 Gastro-esophageal reflux disease without esophagitis: Secondary | ICD-10-CM | POA: Diagnosis not present

## 2017-07-03 DIAGNOSIS — M545 Low back pain: Secondary | ICD-10-CM | POA: Diagnosis not present

## 2017-07-03 DIAGNOSIS — M4316 Spondylolisthesis, lumbar region: Secondary | ICD-10-CM | POA: Diagnosis not present

## 2017-07-07 DIAGNOSIS — M545 Low back pain: Secondary | ICD-10-CM | POA: Diagnosis not present

## 2017-07-28 DIAGNOSIS — D3132 Benign neoplasm of left choroid: Secondary | ICD-10-CM | POA: Diagnosis not present

## 2017-08-21 DIAGNOSIS — I10 Essential (primary) hypertension: Secondary | ICD-10-CM | POA: Diagnosis not present

## 2017-08-21 DIAGNOSIS — M545 Low back pain: Secondary | ICD-10-CM | POA: Diagnosis not present

## 2017-08-21 DIAGNOSIS — L235 Allergic contact dermatitis due to other chemical products: Secondary | ICD-10-CM | POA: Diagnosis not present

## 2017-10-20 DIAGNOSIS — E038 Other specified hypothyroidism: Secondary | ICD-10-CM | POA: Diagnosis not present

## 2017-10-20 DIAGNOSIS — M545 Low back pain: Secondary | ICD-10-CM | POA: Diagnosis not present

## 2017-10-20 DIAGNOSIS — I10 Essential (primary) hypertension: Secondary | ICD-10-CM | POA: Diagnosis not present

## 2017-10-20 DIAGNOSIS — K210 Gastro-esophageal reflux disease with esophagitis: Secondary | ICD-10-CM | POA: Diagnosis not present

## 2017-12-02 DIAGNOSIS — Z23 Encounter for immunization: Secondary | ICD-10-CM | POA: Diagnosis not present

## 2017-12-22 DIAGNOSIS — K210 Gastro-esophageal reflux disease with esophagitis: Secondary | ICD-10-CM | POA: Diagnosis not present

## 2017-12-22 DIAGNOSIS — Z79891 Long term (current) use of opiate analgesic: Secondary | ICD-10-CM | POA: Diagnosis not present

## 2017-12-22 DIAGNOSIS — M545 Low back pain: Secondary | ICD-10-CM | POA: Diagnosis not present

## 2017-12-22 DIAGNOSIS — E038 Other specified hypothyroidism: Secondary | ICD-10-CM | POA: Diagnosis not present

## 2017-12-22 DIAGNOSIS — I10 Essential (primary) hypertension: Secondary | ICD-10-CM | POA: Diagnosis not present

## 2018-02-19 DIAGNOSIS — K210 Gastro-esophageal reflux disease with esophagitis: Secondary | ICD-10-CM | POA: Diagnosis not present

## 2018-02-19 DIAGNOSIS — Z125 Encounter for screening for malignant neoplasm of prostate: Secondary | ICD-10-CM | POA: Diagnosis not present

## 2018-02-19 DIAGNOSIS — M545 Low back pain: Secondary | ICD-10-CM | POA: Diagnosis not present

## 2018-02-19 DIAGNOSIS — J3089 Other allergic rhinitis: Secondary | ICD-10-CM | POA: Diagnosis not present

## 2018-02-19 DIAGNOSIS — E038 Other specified hypothyroidism: Secondary | ICD-10-CM | POA: Diagnosis not present

## 2018-02-19 DIAGNOSIS — I10 Essential (primary) hypertension: Secondary | ICD-10-CM | POA: Diagnosis not present

## 2018-04-22 DIAGNOSIS — J3089 Other allergic rhinitis: Secondary | ICD-10-CM | POA: Diagnosis not present

## 2018-04-22 DIAGNOSIS — I10 Essential (primary) hypertension: Secondary | ICD-10-CM | POA: Diagnosis not present

## 2018-04-22 DIAGNOSIS — M545 Low back pain: Secondary | ICD-10-CM | POA: Diagnosis not present

## 2018-04-22 DIAGNOSIS — K210 Gastro-esophageal reflux disease with esophagitis: Secondary | ICD-10-CM | POA: Diagnosis not present

## 2018-04-22 DIAGNOSIS — E038 Other specified hypothyroidism: Secondary | ICD-10-CM | POA: Diagnosis not present

## 2018-06-22 DIAGNOSIS — M545 Low back pain: Secondary | ICD-10-CM | POA: Diagnosis not present

## 2018-06-22 DIAGNOSIS — J3089 Other allergic rhinitis: Secondary | ICD-10-CM | POA: Diagnosis not present

## 2018-06-22 DIAGNOSIS — I10 Essential (primary) hypertension: Secondary | ICD-10-CM | POA: Diagnosis not present

## 2018-06-22 DIAGNOSIS — N182 Chronic kidney disease, stage 2 (mild): Secondary | ICD-10-CM | POA: Diagnosis not present

## 2018-06-22 DIAGNOSIS — K210 Gastro-esophageal reflux disease with esophagitis: Secondary | ICD-10-CM | POA: Diagnosis not present

## 2018-06-22 DIAGNOSIS — E038 Other specified hypothyroidism: Secondary | ICD-10-CM | POA: Diagnosis not present

## 2018-08-24 DIAGNOSIS — I10 Essential (primary) hypertension: Secondary | ICD-10-CM | POA: Diagnosis not present

## 2018-08-24 DIAGNOSIS — Z1389 Encounter for screening for other disorder: Secondary | ICD-10-CM | POA: Diagnosis not present

## 2018-08-24 DIAGNOSIS — E038 Other specified hypothyroidism: Secondary | ICD-10-CM | POA: Diagnosis not present

## 2018-08-24 DIAGNOSIS — J3089 Other allergic rhinitis: Secondary | ICD-10-CM | POA: Diagnosis not present

## 2018-08-24 DIAGNOSIS — Z0000 Encounter for general adult medical examination without abnormal findings: Secondary | ICD-10-CM | POA: Diagnosis not present

## 2018-08-24 DIAGNOSIS — N182 Chronic kidney disease, stage 2 (mild): Secondary | ICD-10-CM | POA: Diagnosis not present

## 2018-08-24 DIAGNOSIS — M545 Low back pain: Secondary | ICD-10-CM | POA: Diagnosis not present

## 2018-08-24 DIAGNOSIS — K210 Gastro-esophageal reflux disease with esophagitis: Secondary | ICD-10-CM | POA: Diagnosis not present

## 2018-10-26 DIAGNOSIS — K210 Gastro-esophageal reflux disease with esophagitis: Secondary | ICD-10-CM | POA: Diagnosis not present

## 2018-10-26 DIAGNOSIS — E038 Other specified hypothyroidism: Secondary | ICD-10-CM | POA: Diagnosis not present

## 2018-10-26 DIAGNOSIS — N182 Chronic kidney disease, stage 2 (mild): Secondary | ICD-10-CM | POA: Diagnosis not present

## 2018-10-26 DIAGNOSIS — M545 Low back pain: Secondary | ICD-10-CM | POA: Diagnosis not present

## 2018-10-26 DIAGNOSIS — I10 Essential (primary) hypertension: Secondary | ICD-10-CM | POA: Diagnosis not present

## 2018-10-26 DIAGNOSIS — J3089 Other allergic rhinitis: Secondary | ICD-10-CM | POA: Diagnosis not present

## 2018-12-26 DIAGNOSIS — Z23 Encounter for immunization: Secondary | ICD-10-CM | POA: Diagnosis not present

## 2018-12-28 DIAGNOSIS — I10 Essential (primary) hypertension: Secondary | ICD-10-CM | POA: Diagnosis not present

## 2018-12-28 DIAGNOSIS — K219 Gastro-esophageal reflux disease without esophagitis: Secondary | ICD-10-CM | POA: Diagnosis not present

## 2018-12-28 DIAGNOSIS — J3089 Other allergic rhinitis: Secondary | ICD-10-CM | POA: Diagnosis not present

## 2018-12-28 DIAGNOSIS — E038 Other specified hypothyroidism: Secondary | ICD-10-CM | POA: Diagnosis not present

## 2018-12-28 DIAGNOSIS — N182 Chronic kidney disease, stage 2 (mild): Secondary | ICD-10-CM | POA: Diagnosis not present

## 2018-12-28 DIAGNOSIS — M545 Low back pain: Secondary | ICD-10-CM | POA: Diagnosis not present

## 2019-05-03 DIAGNOSIS — J449 Chronic obstructive pulmonary disease, unspecified: Secondary | ICD-10-CM | POA: Diagnosis not present

## 2019-05-03 DIAGNOSIS — N182 Chronic kidney disease, stage 2 (mild): Secondary | ICD-10-CM | POA: Diagnosis not present

## 2019-05-03 DIAGNOSIS — M545 Low back pain: Secondary | ICD-10-CM | POA: Diagnosis not present

## 2019-05-03 DIAGNOSIS — I10 Essential (primary) hypertension: Secondary | ICD-10-CM | POA: Diagnosis not present

## 2019-05-03 DIAGNOSIS — J3089 Other allergic rhinitis: Secondary | ICD-10-CM | POA: Diagnosis not present

## 2019-05-03 DIAGNOSIS — E038 Other specified hypothyroidism: Secondary | ICD-10-CM | POA: Diagnosis not present

## 2019-06-08 DIAGNOSIS — Z23 Encounter for immunization: Secondary | ICD-10-CM | POA: Diagnosis not present

## 2019-07-06 DIAGNOSIS — Z23 Encounter for immunization: Secondary | ICD-10-CM | POA: Diagnosis not present

## 2019-07-27 DIAGNOSIS — J3089 Other allergic rhinitis: Secondary | ICD-10-CM | POA: Diagnosis not present

## 2019-07-27 DIAGNOSIS — I10 Essential (primary) hypertension: Secondary | ICD-10-CM | POA: Diagnosis not present

## 2019-07-27 DIAGNOSIS — E038 Other specified hypothyroidism: Secondary | ICD-10-CM | POA: Diagnosis not present

## 2019-07-27 DIAGNOSIS — N182 Chronic kidney disease, stage 2 (mild): Secondary | ICD-10-CM | POA: Diagnosis not present

## 2019-07-27 DIAGNOSIS — M545 Low back pain: Secondary | ICD-10-CM | POA: Diagnosis not present

## 2019-07-27 DIAGNOSIS — L570 Actinic keratosis: Secondary | ICD-10-CM | POA: Diagnosis not present

## 2019-07-27 DIAGNOSIS — J449 Chronic obstructive pulmonary disease, unspecified: Secondary | ICD-10-CM | POA: Diagnosis not present

## 2019-11-02 DIAGNOSIS — J449 Chronic obstructive pulmonary disease, unspecified: Secondary | ICD-10-CM | POA: Diagnosis not present

## 2019-11-02 DIAGNOSIS — Z0000 Encounter for general adult medical examination without abnormal findings: Secondary | ICD-10-CM | POA: Diagnosis not present

## 2019-11-02 DIAGNOSIS — E038 Other specified hypothyroidism: Secondary | ICD-10-CM | POA: Diagnosis not present

## 2019-11-02 DIAGNOSIS — J3089 Other allergic rhinitis: Secondary | ICD-10-CM | POA: Diagnosis not present

## 2019-11-02 DIAGNOSIS — Z125 Encounter for screening for malignant neoplasm of prostate: Secondary | ICD-10-CM | POA: Diagnosis not present

## 2019-11-02 DIAGNOSIS — M545 Low back pain: Secondary | ICD-10-CM | POA: Diagnosis not present

## 2019-11-02 DIAGNOSIS — I10 Essential (primary) hypertension: Secondary | ICD-10-CM | POA: Diagnosis not present

## 2019-11-02 DIAGNOSIS — L570 Actinic keratosis: Secondary | ICD-10-CM | POA: Diagnosis not present

## 2019-11-02 DIAGNOSIS — Z1331 Encounter for screening for depression: Secondary | ICD-10-CM | POA: Diagnosis not present

## 2019-11-02 DIAGNOSIS — N182 Chronic kidney disease, stage 2 (mild): Secondary | ICD-10-CM | POA: Diagnosis not present

## 2019-12-30 DIAGNOSIS — Z23 Encounter for immunization: Secondary | ICD-10-CM | POA: Diagnosis not present

## 2020-01-03 DIAGNOSIS — J310 Chronic rhinitis: Secondary | ICD-10-CM | POA: Diagnosis not present

## 2020-01-03 DIAGNOSIS — I10 Essential (primary) hypertension: Secondary | ICD-10-CM | POA: Diagnosis not present

## 2020-01-03 DIAGNOSIS — J449 Chronic obstructive pulmonary disease, unspecified: Secondary | ICD-10-CM | POA: Diagnosis not present

## 2020-01-03 DIAGNOSIS — N182 Chronic kidney disease, stage 2 (mild): Secondary | ICD-10-CM | POA: Diagnosis not present

## 2020-01-03 DIAGNOSIS — M545 Low back pain, unspecified: Secondary | ICD-10-CM | POA: Diagnosis not present

## 2020-01-03 DIAGNOSIS — E038 Other specified hypothyroidism: Secondary | ICD-10-CM | POA: Diagnosis not present

## 2020-01-10 DIAGNOSIS — H5213 Myopia, bilateral: Secondary | ICD-10-CM | POA: Diagnosis not present

## 2020-01-10 DIAGNOSIS — H524 Presbyopia: Secondary | ICD-10-CM | POA: Diagnosis not present

## 2020-01-10 DIAGNOSIS — H52223 Regular astigmatism, bilateral: Secondary | ICD-10-CM | POA: Diagnosis not present

## 2020-01-10 DIAGNOSIS — H2513 Age-related nuclear cataract, bilateral: Secondary | ICD-10-CM | POA: Diagnosis not present

## 2020-01-17 DIAGNOSIS — Z23 Encounter for immunization: Secondary | ICD-10-CM | POA: Diagnosis not present

## 2020-01-26 DIAGNOSIS — H31012 Macula scars of posterior pole (postinflammatory) (post-traumatic), left eye: Secondary | ICD-10-CM | POA: Diagnosis not present

## 2020-01-26 DIAGNOSIS — H2513 Age-related nuclear cataract, bilateral: Secondary | ICD-10-CM | POA: Diagnosis not present

## 2020-01-26 DIAGNOSIS — H2512 Age-related nuclear cataract, left eye: Secondary | ICD-10-CM | POA: Diagnosis not present

## 2020-01-26 DIAGNOSIS — H52213 Irregular astigmatism, bilateral: Secondary | ICD-10-CM | POA: Diagnosis not present

## 2020-01-26 DIAGNOSIS — D3132 Benign neoplasm of left choroid: Secondary | ICD-10-CM | POA: Diagnosis not present

## 2020-01-26 DIAGNOSIS — H179 Unspecified corneal scar and opacity: Secondary | ICD-10-CM | POA: Diagnosis not present

## 2020-01-26 DIAGNOSIS — H25013 Cortical age-related cataract, bilateral: Secondary | ICD-10-CM | POA: Diagnosis not present

## 2020-01-26 DIAGNOSIS — H524 Presbyopia: Secondary | ICD-10-CM | POA: Diagnosis not present

## 2020-02-15 DIAGNOSIS — H25812 Combined forms of age-related cataract, left eye: Secondary | ICD-10-CM | POA: Diagnosis not present

## 2020-02-15 DIAGNOSIS — H2512 Age-related nuclear cataract, left eye: Secondary | ICD-10-CM | POA: Diagnosis not present

## 2020-03-06 DIAGNOSIS — I10 Essential (primary) hypertension: Secondary | ICD-10-CM | POA: Diagnosis not present

## 2020-03-06 DIAGNOSIS — E038 Other specified hypothyroidism: Secondary | ICD-10-CM | POA: Diagnosis not present

## 2020-03-06 DIAGNOSIS — N182 Chronic kidney disease, stage 2 (mild): Secondary | ICD-10-CM | POA: Diagnosis not present

## 2020-03-06 DIAGNOSIS — M545 Low back pain, unspecified: Secondary | ICD-10-CM | POA: Diagnosis not present

## 2020-03-08 DIAGNOSIS — H2511 Age-related nuclear cataract, right eye: Secondary | ICD-10-CM | POA: Diagnosis not present

## 2020-03-08 DIAGNOSIS — H25011 Cortical age-related cataract, right eye: Secondary | ICD-10-CM | POA: Diagnosis not present

## 2020-03-14 DIAGNOSIS — H2511 Age-related nuclear cataract, right eye: Secondary | ICD-10-CM | POA: Diagnosis not present

## 2020-03-14 DIAGNOSIS — H25811 Combined forms of age-related cataract, right eye: Secondary | ICD-10-CM | POA: Diagnosis not present

## 2020-03-14 DIAGNOSIS — H25011 Cortical age-related cataract, right eye: Secondary | ICD-10-CM | POA: Diagnosis not present

## 2020-05-11 ENCOUNTER — Encounter (INDEPENDENT_AMBULATORY_CARE_PROVIDER_SITE_OTHER): Payer: Self-pay | Admitting: *Deleted

## 2020-07-26 ENCOUNTER — Other Ambulatory Visit (INDEPENDENT_AMBULATORY_CARE_PROVIDER_SITE_OTHER): Payer: Self-pay | Admitting: *Deleted

## 2020-07-27 ENCOUNTER — Telehealth (INDEPENDENT_AMBULATORY_CARE_PROVIDER_SITE_OTHER): Payer: Self-pay | Admitting: *Deleted

## 2020-07-27 ENCOUNTER — Other Ambulatory Visit (INDEPENDENT_AMBULATORY_CARE_PROVIDER_SITE_OTHER): Payer: Self-pay

## 2020-07-27 ENCOUNTER — Encounter (INDEPENDENT_AMBULATORY_CARE_PROVIDER_SITE_OTHER): Payer: Self-pay | Admitting: *Deleted

## 2020-07-27 DIAGNOSIS — Z86010 Personal history of colonic polyps: Secondary | ICD-10-CM

## 2020-07-27 MED ORDER — PEG 3350-KCL-NA BICARB-NACL 420 G PO SOLR: 4000.0000 mL | Freq: Once | ORAL | 0 refills | Status: AC

## 2020-07-27 NOTE — Telephone Encounter (Signed)
Patient needs trilyte 

## 2020-07-27 NOTE — Telephone Encounter (Signed)
Done

## 2020-08-18 ENCOUNTER — Telehealth (INDEPENDENT_AMBULATORY_CARE_PROVIDER_SITE_OTHER): Payer: Self-pay | Admitting: *Deleted

## 2020-08-18 NOTE — Telephone Encounter (Signed)
Referring MD/PCP: hasanaj  Procedure: tcs w propofol  Reason/Indication:  Hx polyps  Has patient had this procedure before?  Yes, 2016  If so, when, by whom and where?    Is there a family history of colon cancer?  no  Who?  What age when diagnosed?    Is patient diabetic? If yes, Type 1 or Type 2   no      Does patient have prosthetic heart valve or mechanical valve?  no  Do you have a pacemaker/defibrillator?  no  Has patient ever had endocarditis/atrial fibrillation? no  Have you had a stroke/heart attack last 6 mths? no  Does patient use oxygen? no  Has patient had joint replacement within last 12 months?  no  Is patient constipated or do they take laxatives? no  Does patient have a history of alcohol/drug use?  15 yrs ago  Is patient on blood thinner such as Coumadin, Plavix and/or Aspirin? no  Do you take medicine for weight loss?  no  For male patients,: do you still have your menstrual cycle? n/a  Medications: hydrocodone 5/325 mg tid, gabapentin 300 mg 4 daily, gemfibrozil 600 mg bid, simvastatin 20 mg daily, lithium 300 mg daily, citalopram 20 mg daily, levothyroxine 75 mg daily, meloxicam 15 mg daily,trazadone 100 mg daily, pantoprazole 40 mg daily, losartan 100 mg daily, buspirone 30 mg bid, baclofen 10 mg tid prn, fluticasone 50 mg daily, fish oil 1000 mg tid, tadalafil 5 mg daily, fexofenadine 180 mg daily,   Allergies: topomax  Medication Adjustment per Dr Rehman/Dr Jenetta Downer   Procedure date & time: 09/13/20

## 2020-09-06 ENCOUNTER — Encounter (HOSPITAL_COMMUNITY): Payer: Self-pay

## 2020-09-06 NOTE — Patient Instructions (Addendum)
Joshua Barr  09/06/2020     @PREFPERIOPPHARMACY @   Your procedure is scheduled on   09/13/2020.   Report to [facility] at  [facility].M.   Call this number if you have problems the morning of surgery:  [phone]   Remember:       Follow the diet and prep instructions given to you by the office.    Take these medicines the morning of surgery with A SIP OF WATER    buspar, celexa, allegra, gabapentin, levothyroxine, lithium, mobic, protonix.      Please brush your teeth.  Do not wear jewelry, make-up or nail polish.  Do not wear lotions, powders, or perfumes, or deodorant.  Do not shave 48 hours prior to surgery.  Men may shave face and neck.  Do not bring valuables to the hospital.  [facility] is not responsible for any belongings or valuables.  Contacts, dentures or bridgework may not be worn into surgery.  Leave your suitcase in the car.  After surgery it may be brought to your room.  For patients admitted to the hospital, discharge time will be determined by your treatment team.  Patients discharged the day of surgery will not be allowed to drive home and must have someone with them for 24 hours.     Special instructions:    DO NOT smoke tobacco or vape for 24 hours before your procedure.    Please read over the following fact sheets that you were given. Anesthesia Post-op Instructions and Care and Recovery After Surgery      Colonoscopy, Adult, Care After This sheet gives you information about how to care for yourself after your procedure. Your health care provider may also give you more specific instructions. If you have problems or questions, contact your health careprovider. What can I expect after the procedure? After the procedure, it is common to have: A small amount of blood in your stool for 24 hours after the procedure. Some gas. Mild cramping or bloating of your abdomen. Follow these instructions at home: Eating and drinking  Drink  enough fluid to keep your urine pale yellow. Follow instructions from your health care provider about eating or drinking restrictions. Resume your normal diet as instructed by your health care provider. Avoid heavy or fried foods that are hard to digest.  Activity Rest as told by your health care provider. Avoid sitting for a long time without moving. Get up to take short walks every 1-2 hours. This is important to improve blood flow and breathing. Ask for help if you feel weak or unsteady. Return to your normal activities as told by your health care provider. Ask your health care provider what activities are safe for you. Managing cramping and bloating  Try walking around when you have cramps or feel bloated. Apply heat to your abdomen as told by your health care provider. Use the heat source that your health care provider recommends, such as a moist heat pack or a heating pad. Place a towel between your skin and the heat source. Leave the heat on for 20-30 minutes. Remove the heat if your skin turns bright red. This is especially important if you are unable to feel pain, heat, or cold. You may have a greater risk of getting burned.  General instructions If you were given a sedative during the procedure, it can affect you for several hours. Do not drive or operate machinery until your health  care provider says that it is safe. For the first 24 hours after the procedure: Do not sign important documents. Do not drink alcohol. Do your regular daily activities at a slower pace than normal. Eat soft foods that are easy to digest. Take over-the-counter and prescription medicines only as told by your health care provider. Keep all follow-up visits as told by your health care provider. This is important. Contact a health care provider if: You have blood in your stool 2-3 days after the procedure. Get help right away if you have: More than a small spotting of blood in your stool. Large blood  clots in your stool. Swelling of your abdomen. Nausea or vomiting. A fever. Increasing pain in your abdomen that is not relieved with medicine. Summary After the procedure, it is common to have a small amount of blood in your stool. You may also have mild cramping and bloating of your abdomen. If you were given a sedative during the procedure, it can affect you for several hours. Do not drive or operate machinery until your health care provider says that it is safe. Get help right away if you have a lot of blood in your stool, nausea or vomiting, a fever, or increased pain in your abdomen. This information is not intended to replace advice given to you by your health care provider. Make sure you discuss any questions you have with your healthcare provider. Document Revised: 02/26/2019 Document Reviewed: 09/28/2018 Elsevier Patient Education  [place] After This sheet gives you information about how to care for yourself after your procedure. Your health care provider may also give you more specific instructions. If you have problems or questions, contact your health careprovider. What can I expect after the procedure? After the procedure, it is common to have: Tiredness. Forgetfulness about what happened after the procedure. Impaired judgment for important decisions. Nausea or vomiting. Some difficulty with balance. Follow these instructions at home: For the time period you were told by your health care provider:     Rest as needed. Do not participate in activities where you could fall or become injured. Do not drive or use machinery. Do not drink alcohol. Do not take sleeping pills or medicines that cause drowsiness. Do not make important decisions or sign legal documents. Do not take care of children on your own. Eating and drinking Follow the diet that is recommended by your health care provider. Drink enough fluid to keep your urine  pale yellow. If you vomit: Drink water, juice, or soup when you can drink without vomiting. Make sure you have little or no nausea before eating solid foods. General instructions Have a responsible adult stay with you for the time you are told. It is important to have someone help care for you until you are awake and alert. Take over-the-counter and prescription medicines only as told by your health care provider. If you have sleep apnea, surgery and certain medicines can increase your risk for breathing problems. Follow instructions from your health care provider about wearing your sleep device: Anytime you are sleeping, including during daytime naps. While taking prescription pain medicines, sleeping medicines, or medicines that make you drowsy. Avoid smoking. Keep all follow-up visits as told by your health care provider. This is important. Contact a health care provider if: You keep feeling nauseous or you keep vomiting. You feel light-headed. You are still sleepy or having trouble with balance after 24 hours. You develop a rash. You have  a fever. You have redness or swelling around the IV site. Get help right away if: You have trouble breathing. You have new-onset confusion at home. Summary For several hours after your procedure, you may feel tired. You may also be forgetful and have poor judgment. Have a responsible adult stay with you for the time you are told. It is important to have someone help care for you until you are awake and alert. Rest as told. Do not drive or operate machinery. Do not drink alcohol or take sleeping pills. Get help right away if you have trouble breathing, or if you suddenly become confused. This information is not intended to replace advice given to you by your health care provider. Make sure you discuss any questions you have with your healthcare provider. Document Revised: 11/18/2019 Document Reviewed: 02/04/2019 Elsevier Patient Education  2022  [employer].

## 2020-09-11 ENCOUNTER — Encounter (HOSPITAL_COMMUNITY): Payer: Self-pay

## 2020-09-11 ENCOUNTER — Other Ambulatory Visit: Payer: Self-pay

## 2020-09-11 ENCOUNTER — Other Ambulatory Visit (HOSPITAL_COMMUNITY): Admission: RE | Admit: 2020-09-11 | Payer: Medicare Other | Source: Ambulatory Visit

## 2020-09-11 ENCOUNTER — Encounter (HOSPITAL_COMMUNITY)
Admission: RE | Admit: 2020-09-11 | Discharge: 2020-09-11 | Disposition: A | Discharge status: Home / Self Care | Payer: Medicare Other | Source: Ambulatory Visit | Attending: Internal Medicine | Admitting: Internal Medicine

## 2020-09-11 DIAGNOSIS — Z01818 Encounter for other preprocedural examination: Secondary | ICD-10-CM | POA: Insufficient documentation

## 2020-09-11 HISTORY — DX: Anxiety disorder, unspecified: F41.9

## 2020-09-11 HISTORY — DX: Pure hypercholesterolemia, unspecified: E78.00

## 2020-09-11 HISTORY — DX: Essential (primary) hypertension: I10

## 2020-09-11 HISTORY — DX: Hypothyroidism, unspecified: E03.9

## 2020-09-11 HISTORY — DX: Gastro-esophageal reflux disease without esophagitis: K21.9

## 2020-09-11 HISTORY — DX: Unspecified osteoarthritis, unspecified site: M19.90

## 2020-09-11 HISTORY — DX: Bipolar disorder, unspecified: F31.9

## 2020-09-13 ENCOUNTER — Encounter (HOSPITAL_COMMUNITY)
Admission: RE | Disposition: A | Discharge status: Home / Self Care | Payer: Self-pay | Source: Home / Self Care | Attending: Internal Medicine

## 2020-09-13 ENCOUNTER — Encounter (HOSPITAL_COMMUNITY): Payer: Self-pay | Admitting: Internal Medicine

## 2020-09-13 ENCOUNTER — Ambulatory Visit (HOSPITAL_COMMUNITY): Payer: Medicare Other | Admitting: Certified Registered"

## 2020-09-13 ENCOUNTER — Ambulatory Visit (HOSPITAL_COMMUNITY)
Admission: RE | Admit: 2020-09-13 | Discharge: 2020-09-13 | Disposition: A | Discharge status: Home / Self Care | Payer: Medicare Other | Attending: Internal Medicine | Admitting: Internal Medicine

## 2020-09-13 DIAGNOSIS — K648 Other hemorrhoids: Secondary | ICD-10-CM | POA: Insufficient documentation

## 2020-09-13 DIAGNOSIS — Z79890 Hormone replacement therapy (postmenopausal): Secondary | ICD-10-CM | POA: Diagnosis not present

## 2020-09-13 DIAGNOSIS — Z1211 Encounter for screening for malignant neoplasm of colon: Secondary | ICD-10-CM | POA: Insufficient documentation

## 2020-09-13 DIAGNOSIS — Z888 Allergy status to other drugs, medicaments and biological substances status: Secondary | ICD-10-CM | POA: Insufficient documentation

## 2020-09-13 DIAGNOSIS — D123 Benign neoplasm of transverse colon: Secondary | ICD-10-CM | POA: Diagnosis not present

## 2020-09-13 DIAGNOSIS — K219 Gastro-esophageal reflux disease without esophagitis: Secondary | ICD-10-CM | POA: Diagnosis not present

## 2020-09-13 DIAGNOSIS — Z791 Long term (current) use of non-steroidal anti-inflammatories (NSAID): Secondary | ICD-10-CM | POA: Insufficient documentation

## 2020-09-13 DIAGNOSIS — K644 Residual hemorrhoidal skin tags: Secondary | ICD-10-CM | POA: Diagnosis not present

## 2020-09-13 DIAGNOSIS — Z79899 Other long term (current) drug therapy: Secondary | ICD-10-CM | POA: Insufficient documentation

## 2020-09-13 DIAGNOSIS — Z86010 Personal history of colonic polyps: Secondary | ICD-10-CM

## 2020-09-13 DIAGNOSIS — D122 Benign neoplasm of ascending colon: Secondary | ICD-10-CM | POA: Insufficient documentation

## 2020-09-13 DIAGNOSIS — Z09 Encounter for follow-up examination after completed treatment for conditions other than malignant neoplasm: Secondary | ICD-10-CM | POA: Diagnosis not present

## 2020-09-13 DIAGNOSIS — D125 Benign neoplasm of sigmoid colon: Secondary | ICD-10-CM | POA: Insufficient documentation

## 2020-09-13 HISTORY — PX: COLONOSCOPY WITH PROPOFOL: SHX5780

## 2020-09-13 HISTORY — PX: POLYPECTOMY: SHX5525

## 2020-09-13 LAB — HM COLONOSCOPY

## 2020-09-13 SURGERY — COLONOSCOPY WITH PROPOFOL
Anesthesia: General

## 2020-09-13 MED ORDER — GLYCOPYRROLATE 0.2 MG/ML IJ SOLN
INTRAMUSCULAR | Status: DC | PRN
  Administered 2020-09-13: .1 mg via INTRAVENOUS

## 2020-09-13 MED ORDER — LACTATED RINGERS IV SOLN: INTRAVENOUS | Status: DC

## 2020-09-13 MED ORDER — LIDOCAINE HCL (CARDIAC) PF 100 MG/5ML IV SOSY
PREFILLED_SYRINGE | INTRAVENOUS | Status: DC | PRN
  Administered 2020-09-13: 60 mg via INTRAVENOUS

## 2020-09-13 MED ORDER — PROPOFOL 10 MG/ML IV BOLUS
INTRAVENOUS | Status: DC | PRN
  Administered 2020-09-13: 20 mg via INTRAVENOUS
  Administered 2020-09-13: 30 mg via INTRAVENOUS

## 2020-09-13 MED ORDER — PROPOFOL 10 MG/ML IV BOLUS
INTRAVENOUS | Status: DC | PRN
  Administered 2020-09-13: 100 mg via INTRAVENOUS
  Administered 2020-09-13: 125 ug/kg/min via INTRAVENOUS

## 2020-09-13 MED ORDER — GLYCOPYRROLATE PF 0.2 MG/ML IJ SOSY
PREFILLED_SYRINGE | INTRAMUSCULAR | Status: AC
  Filled 2020-09-13: qty 1

## 2020-09-13 NOTE — Anesthesia Procedure Notes (Signed)
Date/Time: 09/13/2020 10:41 AM Performed by: Orlie Dakin, CRNA Pre-anesthesia Checklist: Patient identified, Emergency Drugs available, Suction available and Patient being monitored Patient Re-evaluated:Patient Re-evaluated prior to induction Oxygen Delivery Method: Nasal cannula Induction Type: IV induction Placement Confirmation: positive ETCO2

## 2020-09-13 NOTE — Transfer of Care (Signed)
Immediate Anesthesia Transfer of Care Note  Patient: Joshua Barr  Procedure(s) Performed: COLONOSCOPY WITH PROPOFOL POLYPECTOMY  Patient Location: Short Stay  Anesthesia Type:General  Level of Consciousness: awake, alert  and oriented  Airway & Oxygen Therapy: Patient Spontanous Breathing  Post-op Assessment: Report given to RN, Post -op Vital signs reviewed and stable and Patient moving all extremities X 4  Post vital signs: Reviewed and stable  Last Vitals:  Vitals Value Taken Time  BP 124/71 09/13/20 1101  Temp 36.9 C 09/13/20 1101  Pulse 64 09/13/20 1101  Resp 16 09/13/20 1101  SpO2 96 % 09/13/20 1101    Last Pain:  Vitals:   09/13/20 1101  TempSrc: Oral  PainSc: 0-No pain         Complications: No notable events documented.

## 2020-09-13 NOTE — Anesthesia Postprocedure Evaluation (Signed)
Anesthesia Post Note  Patient: Joshua Barr  Procedure(s) Performed: COLONOSCOPY WITH PROPOFOL POLYPECTOMY  Patient location during evaluation: Phase II Anesthesia Type: General Level of consciousness: awake Pain management: pain level controlled Vital Signs Assessment: post-procedure vital signs reviewed and stable Respiratory status: spontaneous breathing and respiratory function stable Cardiovascular status: blood pressure returned to baseline and stable Postop Assessment: no headache and no apparent nausea or vomiting Anesthetic complications: no Comments: Late entry   No notable events documented.   Last Vitals:  Vitals:   09/13/20 1101  BP: 124/71  Pulse: 64  Resp: 16  Temp: 36.9 C  SpO2: 96%    Last Pain:  Vitals:   09/13/20 1101  TempSrc: Oral  PainSc: 0-No pain                 Louann Sjogren

## 2020-09-13 NOTE — Discharge Instructions (Signed)
No aspirin or NSAIDs for 24 hours. Resume other medications and diet as before. No driving for 24 hours. Physician will call with biopsy results.

## 2020-09-13 NOTE — Anesthesia Preprocedure Evaluation (Signed)
Anesthesia Evaluation  Patient identified by MRN, date of birth, ID band Patient awake    Reviewed: Allergy & Precautions, H&P , NPO status , Patient's Chart, lab work & pertinent test results, reviewed documented beta blocker date and time   Airway Mallampati: II  TM Distance: >3 FB Neck ROM: full    Dental no notable dental hx.    Pulmonary neg pulmonary ROS,    Pulmonary exam normal breath sounds clear to auscultation       Cardiovascular Exercise Tolerance: Good hypertension, negative cardio ROS   Rhythm:regular Rate:Normal     Neuro/Psych PSYCHIATRIC DISORDERS Anxiety Bipolar Disorder negative neurological ROS     GI/Hepatic Neg liver ROS, GERD  Medicated,  Endo/Other  Hypothyroidism   Renal/GU negative Renal ROS  negative genitourinary   Musculoskeletal   Abdominal   Peds  Hematology negative hematology ROS (+)   Anesthesia Other Findings   Reproductive/Obstetrics negative OB ROS                             Anesthesia Physical Anesthesia Plan  ASA: 2  Anesthesia Plan: General   Post-op Pain Management:    Induction:   PONV Risk Score and Plan: Propofol infusion  Airway Management Planned:   Additional Equipment:   Intra-op Plan:   Post-operative Plan:   Informed Consent: I have reviewed the patients History and Physical, chart, labs and discussed the procedure including the risks, benefits and alternatives for the proposed anesthesia with the patient or authorized representative who has indicated his/her understanding and acceptance.     Dental Advisory Given  Plan Discussed with: CRNA  Anesthesia Plan Comments:         Anesthesia Quick Evaluation

## 2020-09-13 NOTE — H&P (Signed)
Joshua Barr is an 55 y.o. male.   Chief Complaint: Patient is here for colonoscopy HPI: Patient is 55 year old Caucasian male who underwent screening colonoscopy in 2016 and had 1 polyp/adenoma removed.  He was advised to come back in 5 years.  His last exam was an [facility].  He denies abdominal pain or rectal bleeding.  His bowels are irregular but that has been his pattern.  He has good appetite and his weight stable. He does not take aspirin or anticoagulants. Family history is negative for CRC.  Past Medical History:  Diagnosis Date   Anxiety    Arthritis    Bipolar disorder ([place])    GERD (gastroesophageal reflux disease)    Hypercholesteremia    Hypertension    Hypothyroidism     Past Surgical History:  Procedure Laterality Date   ANTERIOR CERVICAL DECOMP/DISCECTOMY FUSION     C6-C7   EYE SURGERY      History reviewed. No pertinent family history. Social History:  reports that he has never smoked. He has never used smokeless tobacco. He reports previous alcohol use. He reports previous drug use.  Allergies:  Allergies  Allergen Reactions   Topamax [Topiramate] Other (See Comments)    seizure    Medications Prior to Admission  Medication Sig Dispense Refill   busPIRone (BUSPAR) 15 MG tablet Take 30 mg by mouth 2 (two) times daily.     carboxymethylcellulose (REFRESH PLUS) 0.5 % SOLN Place 1 drop into both eyes daily as needed (dry eyes).     citalopram (CELEXA) 20 MG tablet Take 20 mg by mouth daily.     diclofenac Sodium (VOLTAREN) 1 % GEL Apply 1 application topically 4 (four) times daily as needed (pain).     fexofenadine (ALLEGRA) 180 MG tablet Take 180 mg by mouth daily.     fluticasone (FLONASE) 50 MCG/ACT nasal spray Place 1 spray into both nostrils daily.     gabapentin (NEURONTIN) 300 MG capsule Take 300 mg by mouth 4 (four) times daily.     gemfibrozil (LOPID) 600 MG tablet Take 600 mg by mouth 2 (two) times daily before a meal.     levothyroxine  (SYNTHROID) 75 MCG tablet Take 75 mcg by mouth daily before breakfast.     lithium carbonate 300 MG capsule Take 300 mg by mouth daily.     losartan (COZAAR) 100 MG tablet Take 100 mg by mouth daily.     meloxicam (MOBIC) 15 MG tablet Take 15 mg by mouth daily.     Menthol, Topical Analgesic, (BIOFREEZE) 10 % LIQD Apply 1 application topically daily as needed (pain).     Omega-3 Fatty Acids (FISH OIL) 1000 MG CAPS Take 1,000 mg by mouth in the morning and at bedtime.     pantoprazole (PROTONIX) 40 MG tablet Take 40 mg by mouth daily.     simvastatin (ZOCOR) 20 MG tablet Take 20 mg by mouth at bedtime.     tadalafil (CIALIS) 5 MG tablet Take 5 mg by mouth every other day.     traZODone (DESYREL) 100 MG tablet Take 50 mg by mouth at bedtime.      No results found for this or any previous visit (from the past 48 hour(s)). No results found.  Review of Systems  There were no vitals taken for this visit. Physical Exam HENT:     Mouth/Throat:     Mouth: Mucous membranes are moist.     Pharynx: Oropharynx is clear.  Eyes:  General: No scleral icterus.    Conjunctiva/sclera: Conjunctivae normal.  Cardiovascular:     Rate and Rhythm: Normal rate and regular rhythm.     Heart sounds: Normal heart sounds. No murmur heard. Pulmonary:     Effort: Pulmonary effort is normal.     Breath sounds: Normal breath sounds.  Abdominal:     Comments: Abdomen is symmetrical.  He has small scar over right anterior superior iliac spine.  Abdomen is soft and nontender with organomegaly or masses.  Musculoskeletal:        General: No swelling.     Cervical back: Neck supple.  Lymphadenopathy:     Cervical: No cervical adenopathy.  Skin:    General: Skin is warm and dry.  Neurological:     Mental Status: He is alert.     Assessment/Plan  History of colonic adenoma Surveillance colonoscopy  Hildred Laser, MD 09/13/2020, 10:27 AM

## 2020-09-13 NOTE — Op Note (Signed)
[facility] Patient Name: Joshua Barr Procedure Date: 09/13/2020 10:11 AM MRN: [id number] Date of Birth: 12-16-1965 Attending MD: Hildred Laser , MD CSN: [id number] Age: 55 Admit Type: Outpatient Procedure:                Colonoscopy Indications:              High risk colon cancer surveillance: Personal                            history of colonic polyps Providers:                Hildred Laser, MD, Otis Peak B. Sharon Seller, RN, Casimer Bilis, Technician Referring MD:             Stoney Bang MD, MD Medicines:                Propofol per Anesthesia Complications:            No immediate complications. Estimated Blood Loss:     Estimated blood loss was minimal. Procedure:                Pre-Anesthesia Assessment:                           - Prior to the procedure, a History and Physical                            was performed, and patient medications and                            allergies were reviewed. The patient's tolerance of                            previous anesthesia was also reviewed. The risks                            and benefits of the procedure and the sedation                            options and risks were discussed with the patient.                            All questions were answered, and informed consent                            was obtained. Prior Anticoagulants: The patient has                            taken no previous anticoagulant or antiplatelet                            agents except for NSAID medication. ASA Grade  Assessment: II - A patient with mild systemic                            disease. After reviewing the risks and benefits,                            the patient was deemed in satisfactory condition to                            undergo the procedure.                           After obtaining informed consent, the colonoscope                            was passed under  direct vision. Throughout the                            procedure, the patient's blood pressure, pulse, and                            oxygen saturations were monitored continuously. The                            [id number]-[id number] ([id number]) scope was introduced through                            the anus and advanced to the the cecum, identified                            by appendiceal orifice and ileocecal valve. The                            colonoscopy was performed without difficulty. The                            patient tolerated the procedure well. The quality                            of the bowel preparation was adequate. The                            ileocecal valve, appendiceal orifice, and rectum                            were photographed. Scope In: 10:34:50 AM Scope Out: 10:57:17 AM Scope Withdrawal Time: 0 hours 18 minutes 46 seconds  Total Procedure Duration: 0 hours 22 minutes 27 seconds  Findings:      The perianal and digital rectal examinations were normal.      Three polyps were found in the splenic flexure, hepatic flexure and       ascending colon. The polyps were small in size. These were biopsied with       a cold forceps for histology. The pathology specimen was placed into  Bottle Number 1.      A 4 mm polyp was found in the sigmoid colon. The polyp was removed with       a cold snare. Resection and retrieval were complete. The pathology       specimen was placed into Bottle Number 1.      Internal hemorrhoids were found during retroflexion. The hemorrhoids       were small. Impression:               - Three small polyps at the splenic flexure, at the                            hepatic flexure and in the ascending colon.                            Biopsied.                           - One 4 mm polyp in the sigmoid colon, removed with                            a cold snare. Resected and retrieved.                           - Internal  hemorrhoids. Moderate Sedation:      Per Anesthesia Care Recommendation:           - Patient has a contact number available for                            emergencies. The signs and symptoms of potential                            delayed complications were discussed with the                            patient. Return to normal activities tomorrow.                            Written discharge instructions were provided to the                            patient.                           - Resume previous diet today.                           - Continue present medications.                           - No aspirin, ibuprofen, naproxen, or other                            non-steroidal anti-inflammatory drugs for 1 day.                           -  Await pathology results.                           - Repeat colonoscopy is recommended. The                            colonoscopy date will be determined after pathology                            results from today's exam become available for                            review. Procedure Code(s):        --- Professional ---                           [phone], Colonoscopy, flexible; with removal of                            tumor(s), polyp(s), or other lesion(s) by snare                            technique                           45380, 74, Colonoscopy, flexible; with biopsy,                            single or multiple Diagnosis Code(s):        --- Professional ---                           K63.5, Polyp of colon                           Z86.010, Personal history of colonic polyps                           K64.8, Other hemorrhoids CPT copyright 2019 [employer]. All rights reserved. The codes documented in this report are preliminary and upon coder review may  be revised to meet current compliance requirements. Hildred Laser, MD Hildred Laser, MD 09/13/2020 11:05:29 AM This report has been signed electronically. Number of  Addenda: 0

## 2020-09-14 LAB — SURGICAL PATHOLOGY

## 2020-09-19 ENCOUNTER — Encounter (INDEPENDENT_AMBULATORY_CARE_PROVIDER_SITE_OTHER): Payer: Self-pay | Admitting: *Deleted

## 2020-09-20 ENCOUNTER — Encounter (HOSPITAL_COMMUNITY): Payer: Self-pay | Admitting: Internal Medicine

## 2023-02-13 ENCOUNTER — Other Ambulatory Visit: Payer: Self-pay

## 2023-02-13 ENCOUNTER — Emergency Department (HOSPITAL_COMMUNITY)
Admission: EM | Admit: 2023-02-13 | Discharge: 2023-02-13 | Disposition: A | Discharge status: Home / Self Care | Payer: Medicare HMO | Attending: Emergency Medicine | Admitting: Emergency Medicine

## 2023-02-13 ENCOUNTER — Encounter (HOSPITAL_COMMUNITY): Payer: Self-pay

## 2023-02-13 ENCOUNTER — Emergency Department (HOSPITAL_COMMUNITY): Payer: Medicare HMO

## 2023-02-13 DIAGNOSIS — I10 Essential (primary) hypertension: Secondary | ICD-10-CM | POA: Diagnosis not present

## 2023-02-13 DIAGNOSIS — R1013 Epigastric pain: Secondary | ICD-10-CM | POA: Diagnosis present

## 2023-02-13 DIAGNOSIS — K2100 Gastro-esophageal reflux disease with esophagitis, without bleeding: Secondary | ICD-10-CM | POA: Diagnosis not present

## 2023-02-13 DIAGNOSIS — Z79899 Other long term (current) drug therapy: Secondary | ICD-10-CM | POA: Insufficient documentation

## 2023-02-13 DIAGNOSIS — E039 Hypothyroidism, unspecified: Secondary | ICD-10-CM | POA: Diagnosis not present

## 2023-02-13 LAB — CBC
HCT: 35.3 % — ABNORMAL LOW (ref 39.0–52.0)
Hemoglobin: 11.7 g/dL — ABNORMAL LOW (ref 13.0–17.0)
MCH: 30.3 pg (ref 26.0–34.0)
MCHC: 33.1 g/dL (ref 30.0–36.0)
MCV: 91.5 fL (ref 80.0–100.0)
Platelets: 252 10*3/uL (ref 150–400)
RBC: 3.86 MIL/uL — ABNORMAL LOW (ref 4.22–5.81)
RDW: 14.8 % (ref 11.5–15.5)
WBC: 11.6 10*3/uL — ABNORMAL HIGH (ref 4.0–10.5)
nRBC: 0 % (ref 0.0–0.2)

## 2023-02-13 LAB — BASIC METABOLIC PANEL
Anion gap: 9 (ref 5–15)
BUN: 15 mg/dL (ref 6–20)
CO2: 23 mmol/L (ref 22–32)
Calcium: 9.4 mg/dL (ref 8.9–10.3)
Chloride: 105 mmol/L (ref 98–111)
Creatinine, Ser: 1.3 mg/dL — ABNORMAL HIGH (ref 0.61–1.24)
GFR, Estimated: >60 mL/min (ref >60)
Glucose, Bld: 130 mg/dL — ABNORMAL HIGH (ref 70–99)
Potassium: 3.7 mmol/L (ref 3.5–5.1)
Sodium: 137 mmol/L (ref 135–145)

## 2023-02-13 LAB — HEPATIC FUNCTION PANEL
ALT: 12 U/L (ref 0–44)
AST: 17 U/L (ref 15–41)
Albumin: 3.7 g/dL (ref 3.5–5.0)
Alkaline Phosphatase: 78 U/L (ref 38–126)
Bilirubin, Direct: 0.1 mg/dL (ref 0.0–0.2)
Indirect Bilirubin: 0.5 mg/dL (ref 0.3–0.9)
Total Bilirubin: 0.6 mg/dL (ref <1.2)
Total Protein: 6.9 g/dL (ref 6.5–8.1)

## 2023-02-13 LAB — TROPONIN I (HIGH SENSITIVITY): Troponin I (High Sensitivity): 3 ng/L (ref <18)

## 2023-02-13 LAB — LIPASE, BLOOD: Lipase: 39 U/L (ref 11–51)

## 2023-02-13 MED ORDER — SUCRALFATE 1 G PO TABS: 1.0000 g | ORAL_TABLET | Freq: Three times a day (TID) | ORAL | 0 refills | Status: DC

## 2023-02-13 MED ORDER — ALUM & MAG HYDROXIDE-SIMETH 200-200-20 MG/5ML PO SUSP
30.0000 mL | Freq: Once | ORAL | Status: AC
  Administered 2023-02-13: 30 mL via ORAL
  Filled 2023-02-13: qty 30

## 2023-02-13 MED ORDER — LIDOCAINE VISCOUS HCL 2 % MT SOLN
15.0000 mL | Freq: Once | OROMUCOSAL | Status: AC
  Administered 2023-02-13: 15 mL via OROMUCOSAL
  Filled 2023-02-13: qty 15

## 2023-02-13 NOTE — Discharge Instructions (Addendum)
Your lab tests, chest x-ray and EKG are all reassuring today, this does not appear to be a cardiac or lung problem.  Your symptoms suggest you have a worsening of your acid reflux including possible esophagitis.  I encourage you to continue taking your Protonix but I have added a medicine called Carafate which should help you with your symptoms.  You need to follow-up with a gastroenterologist and have been referred to Dr. Tasia Catchings for this.  In the interim I recommend avoiding eating spicy, acidic foods which can make your symptoms worse.  You may also supplement with Maalox or Mylanta if you continue to have symptoms despite adding the Carafate.

## 2023-02-13 NOTE — ED Triage Notes (Signed)
Pt to er, pt states that he was drinking coffee three days ago and had a sudden onset of epigastric pain, states that it felt like something was squeezing.  Pt states that since then he has been having acid reflux/ epigastric pain.

## 2023-02-13 NOTE — ED Provider Notes (Signed)
[place] EM[facility]ENC[facility] DEPARTMENT AT [facility] Provider Note   CSN: [id number] Arrival date & time: 02/13/23  1433     History  Chief Complaint  Patient presents with   Abdominal Pain    Joshua Barr is a 57 y.o. male with a history including GERD (on protonix for years), bipolar disorder, htn, hypothyroidism, presenting with a 3 day history of a tight, squeezing, waxing and waning pain in the mid chest and epigastric region.  It started suddenly when he drank coffee 3 days ago.  He does endorse increased acid reflux since this event.  He denies sob,  cough, diaphoresis, palpitation, denies back pain, n/v, abdominal distention.  He has found no alleviators, has been able to eat and drink but transiently worsens the sx. Took rolaids with some improvement.   The history is provided by the patient.       Home Medications Prior to Admission medications   Medication Sig Start Date End Date Taking? Authorizing Provider  sucralfate (CARAFATE) 1 g tablet Take 1 tablet (1 g total) by mouth 4 (four) times daily -  with meals and at bedtime. 02/13/23  Yes Chancelor Hardrick, Raynelle Fanning, PA-C  busPIRone (BUSPAR) 15 MG tablet Take 30 mg by mouth 2 (two) times daily.    [provider]  carboxymethylcellulose (REFRESH PLUS) 0.5 % SOLN Place 1 drop into both eyes daily as needed (dry eyes).    [provider]  citalopram (CELEXA) 20 MG tablet Take 20 mg by mouth daily.    [provider]  diclofenac Sodium (VOLTAREN) 1 % GEL Apply 1 application topically 4 (four) times daily as needed (pain).    [provider]  fexofenadine (ALLEGRA) 180 MG tablet Take 180 mg by mouth daily.    [provider]  fluticasone (FLONASE) 50 MCG/ACT nasal spray Place 1 spray into both nostrils daily.    [provider]  gabapentin (NEURONTIN) 300 MG capsule Take 300 mg by mouth 4 (four) times daily.    [provider]  gemfibrozil (LOPID) 600 MG tablet Take 600  mg by mouth 2 (two) times daily before a meal.    [provider]  levothyroxine (SYNTHROID) 75 MCG tablet Take 75 mcg by mouth daily before breakfast.    [provider]  lithium carbonate 300 MG capsule Take 300 mg by mouth daily. 08/13/20   [provider]  losartan (COZAAR) 100 MG tablet Take 100 mg by mouth daily.    [provider]  meloxicam (MOBIC) 15 MG tablet Take 1 tablet (15 mg total) by mouth daily. 09/14/20   Rehman, Joline Maxcy, MD  Menthol, Topical Analgesic, (BIOFREEZE) 10 % LIQD Apply 1 application topically daily as needed (pain).    [provider]  Omega-3 Fatty Acids (FISH OIL) 1000 MG CAPS Take 1,000 mg by mouth in the morning and at bedtime.    [provider]  pantoprazole (PROTONIX) 40 MG tablet Take 40 mg by mouth daily.    [provider]  simvastatin (ZOCOR) 20 MG tablet Take 20 mg by mouth at bedtime.    [provider]  tadalafil (CIALIS) 5 MG tablet Take 5 mg by mouth every other day.    [provider]  traZODone (DESYREL) 100 MG tablet Take 50 mg by mouth at bedtime.    [provider]      Allergies    Topamax [topiramate]    Review of Systems   Review of Systems  Constitutional:  Negative for chills and fever.  HENT:  Negative for congestion, sore throat and voice change.   Eyes: Negative.   Respiratory:  Negative for cough, chest tightness and shortness of breath.   Cardiovascular:  Positive for chest pain. Negative for palpitations.  Gastrointestinal:  Positive for abdominal pain. Negative for constipation, nausea and vomiting.  Genitourinary: Negative.   Musculoskeletal:  Negative for arthralgias, joint swelling and neck pain.  Skin: Negative.  Negative for rash and wound.  Neurological:  Negative for dizziness, weakness, light-headedness, numbness and headaches.  Psychiatric/Behavioral: Negative.    All other systems reviewed and are negative.   Physical  Exam Updated Vital Signs BP 137/82 (BP Location: Right Arm)   Pulse 65   Temp 97.9 F (36.6 C) (Axillary)   Resp 12   Ht 5\' 11"  (1.803 m)   Wt 88.9 kg   SpO2 98%   BMI 27.34 kg/m  Physical Exam Vitals and nursing note reviewed.  Constitutional:      Appearance: He is well-developed.  HENT:     Head: Normocephalic and atraumatic.  Eyes:     Conjunctiva/sclera: Conjunctivae normal.  Cardiovascular:     Rate and Rhythm: Normal rate and regular rhythm.     Heart sounds: Normal heart sounds.  Pulmonary:     Effort: Pulmonary effort is normal.     Breath sounds: Normal breath sounds. No wheezing.  Abdominal:     General: Bowel sounds are normal. There is no distension.     Palpations: Abdomen is soft.     Tenderness: There is abdominal tenderness in the epigastric area. Negative signs include Murphy's sign.  Musculoskeletal:        General: Normal range of motion.     Cervical back: Normal range of motion.  Skin:    General: Skin is warm and dry.  Neurological:     Mental Status: He is alert.    ED Results / Procedures / Treatments   Labs (all labs ordered are listed, but only abnormal results are displayed) Labs Reviewed  BASIC METABOLIC PANEL - Abnormal; Notable for the following components:      Result Value   Glucose, Bld 130 (*)    Creatinine, Ser 1.30 (*)    All other components within normal limits  CBC - Abnormal; Notable for the following components:   WBC 11.6 (*)    RBC 3.86 (*)    Hemoglobin 11.7 (*)    HCT 35.3 (*)    All other components within normal limits  HEPATIC FUNCTION PANEL  LIPASE, BLOOD  TROPONIN I (HIGH SENSITIVITY)    EKG None  Radiology DG Chest 2 View  Result Date: 02/13/2023 CLINICAL DATA:  Epigastric pain EXAM: CHEST - 2 VIEW COMPARISON:  None Available. FINDINGS: The heart size and mediastinal contours are within normal limits. Both lungs are clear. The visualized skeletal structures are unremarkable. IMPRESSION: No active  cardiopulmonary disease. Electronically Signed   By: Duanne Guess D.O.   On: 02/13/2023 15:02    Procedures Procedures    Medications Ordered in ED Medications  alum & mag hydroxide-simeth (MAALOX/MYLANTA) 200-200-20 MG/5ML suspension 30 mL (30 mLs Oral Given 02/13/23 1747)  lidocaine (XYLOCAINE) 2 % viscous mouth solution 15 mL (15 mLs Mouth/Throat Given 02/13/23 1747)    ED Course/ Medical Decision Making/ A&P  Medical Decision Making Patient presenting with episodic pain mid chest with food consumption which started when drinking coffee several days ago.  This symptom persists and is transiently worse with eating, not associated with shortness of breath, palpitations, diaphoresis.  Symptoms are most suggestive of either an esophagitis, gastritis, or both.  He is currently on Protonix, we are adding Carafate as we gave him a GI cocktail here and it offered some but not complete relief of his symptoms.  I have referred him to GI for further evaluation, suspect he may benefit from an upper endoscopy if his symptoms do not improve with this treatment plan.  Discussed home food choices to help minimize symptoms.  Amount and/or Complexity of Data Reviewed Labs: ordered.    Details: Labs are reassuring, his hepatic function panel is normal, normal lipase, negative troponin.  Radiology: ordered.    Details: Chest x-ray is negative for acute findings  Risk OTC drugs. Prescription drug management.           Final Clinical Impression(s) / ED Diagnoses Final diagnoses:  Gastroesophageal reflux disease with esophagitis, unspecified whether hemorrhage    Rx / DC Orders ED Discharge Orders          Ordered    sucralfate (CARAFATE) 1 g tablet  3 times daily with meals & bedtime        02/13/23 1817              Burgess Amor, PA-C 02/15/23 1830    Royanne Foots, DO 02/22/23 1042

## 2023-02-18 ENCOUNTER — Ambulatory Visit (INDEPENDENT_AMBULATORY_CARE_PROVIDER_SITE_OTHER): Payer: Medicare HMO | Admitting: Gastroenterology

## 2023-02-18 ENCOUNTER — Encounter (INDEPENDENT_AMBULATORY_CARE_PROVIDER_SITE_OTHER): Payer: Self-pay | Admitting: Gastroenterology

## 2023-02-18 VITALS — BP 115/75 | HR 55 | Temp 98.1°F | Ht 71.0 in | Wt 205.1 lb

## 2023-02-18 DIAGNOSIS — R1310 Dysphagia, unspecified: Secondary | ICD-10-CM | POA: Diagnosis not present

## 2023-02-18 DIAGNOSIS — R1319 Other dysphagia: Secondary | ICD-10-CM

## 2023-02-18 DIAGNOSIS — D649 Anemia, unspecified: Secondary | ICD-10-CM | POA: Diagnosis not present

## 2023-02-18 DIAGNOSIS — K219 Gastro-esophageal reflux disease without esophagitis: Secondary | ICD-10-CM | POA: Insufficient documentation

## 2023-02-18 DIAGNOSIS — K5903 Drug induced constipation: Secondary | ICD-10-CM

## 2023-02-18 DIAGNOSIS — K5909 Other constipation: Secondary | ICD-10-CM | POA: Diagnosis not present

## 2023-02-18 MED ORDER — POLYETHYLENE GLYCOL 3350 17 G PO PACK: 17.0000 g | PACK | Freq: Two times a day (BID) | ORAL | 0 refills | Status: AC

## 2023-02-18 MED ORDER — PSYLLIUM 58.6 % PO PACK: 1.0000 | PACK | Freq: Two times a day (BID) | ORAL | 2 refills | Status: AC

## 2023-02-18 NOTE — Progress Notes (Signed)
Joshua Barr , M.D. Gastroenterology & Hepatology [facility] Gastroenterology [address] [place], [place] [address] Primary Care Physician: Joshua Deiters, MD [address] [place] [address]  Chief Complaint:  Dysphagia   History of Present Illness: Joshua Barr is a 57 y.o. male with bipolar disorder, hypertension, hypothyroidism, chronic uncontrolled GERD, chronic pain on opioids who presents for evaluation of Dysphagia   Patient reports that last week he was having soda where he had sudden onset of chest discomfort and felt like the liquid was not moving down.  Patient reports he has been having solid liquid food dysphagia for few months but it has progressed to "liquid and solid past couple weeks.  Patient has been taking PPI for many years without complete relief. The patient denies having any nausea, vomiting, fever, chills, hematochezia, melena, hematemesis, abdominal distention, abdominal pain, diarrhea, jaundice, pruritus or weight loss.  Last UJW:JXBJ Last Colonoscopy: 2022 Dr Joshua Barr  4 small polyps removed from his colon and they are all tubular adenomas  Next colonoscopy in 5 years.  Recent labs with hemoglobin 11.7 MCV 90 Platelet 256 normal liver enzymes  FHx: neg for any gastrointestinal/liver disease, no malignancies Social: neg smoking, alcohol or illicit drug use Surgical: no abdominal surgeries  Past Medical History: Past Medical History:  Diagnosis Date   Anxiety    Arthritis    Bipolar disorder (HCC)    GERD (gastroesophageal reflux disease)    Hypercholesteremia    Hypertension    Hypothyroidism     Past Surgical History: Past Surgical History:  Procedure Laterality Date   ANTERIOR CERVICAL DECOMP/DISCECTOMY FUSION     C6-C7   COLONOSCOPY WITH PROPOFOL N/A 09/13/2020   Procedure: COLONOSCOPY WITH PROPOFOL;  Surgeon: Joshua Hippo, MD;  [facility];  Service: Endoscopy;  Laterality:  N/A;  1050   EYE SURGERY     POLYPECTOMY  09/13/2020   Procedure: POLYPECTOMY;  Surgeon: Joshua Hippo, MD;  [facility]: [facility];  Service: Endoscopy;;    Family History:No family history on file.  Social History: Social History   Tobacco Use  Smoking Status Former   Types: Cigarettes   Passive exposure: Current  Smokeless Tobacco Never   Social History   Substance and Sexual Activity  Alcohol Use Not Currently   Social History   Substance and Sexual Activity  Drug Use Not Currently    Allergies: Allergies  Allergen Reactions   Topamax [Topiramate] Other (See Comments)    seizure    Medications: Current Outpatient Medications  Medication Sig Dispense Refill   busPIRone (BUSPAR) 15 MG tablet Take 30 mg by mouth 2 (two) times daily.     carboxymethylcellulose (REFRESH PLUS) 0.5 % SOLN Place 1 drop into both eyes daily as needed (dry eyes).     citalopram (CELEXA) 20 MG tablet Take 20 mg by mouth daily.     diclofenac Sodium (VOLTAREN) 1 % GEL Apply 1 application topically 4 (four) times daily as needed (pain).     fexofenadine (ALLEGRA) 180 MG tablet Take 180 mg by mouth daily.     fluticasone (FLONASE) 50 MCG/ACT nasal spray Place 1 spray into both nostrils daily.     gabapentin (NEURONTIN) 300 MG capsule Take 300 mg by mouth 4 (four) times daily.     gemfibrozil (LOPID) 600 MG tablet Take 600 mg by mouth 2 (two) times daily before a meal.     levothyroxine (SYNTHROID) 75 MCG tablet Take  75 mcg by mouth daily before breakfast.     lithium carbonate 300 MG capsule Take 300 mg by mouth daily.     losartan (COZAAR) 100 MG tablet Take 100 mg by mouth daily.     meloxicam (MOBIC) 15 MG tablet Take 1 tablet (15 mg total) by mouth daily.     Menthol, Topical Analgesic, (BIOFREEZE) 10 % LIQD Apply 1 application topically daily as needed (pain).     Omega-3 Fatty Acids (FISH OIL) 1000 MG CAPS Take 1,000 mg by mouth in the morning and at bedtime.     pantoprazole  (PROTONIX) 40 MG tablet Take 40 mg by mouth daily.     simvastatin (ZOCOR) 20 MG tablet Take 20 mg by mouth at bedtime.     sucralfate (CARAFATE) 1 g tablet Take 1 tablet (1 g total) by mouth 4 (four) times daily -  with meals and at bedtime. 30 tablet 0   tadalafil (CIALIS) 5 MG tablet Take 5 mg by mouth every other day.     traZODone (DESYREL) 100 MG tablet Take 50 mg by mouth at bedtime.     No current facility-administered medications for this visit.    Review of Systems: GENERAL: negative for malaise, night sweats HEENT: No changes in hearing or vision, no nose bleeds or other nasal problems. NECK: Negative for lumps, goiter, pain and significant neck swelling RESPIRATORY: Negative for cough, wheezing CARDIOVASCULAR: Negative for chest pain, leg swelling, palpitations, orthopnea GI: SEE HPI MUSCULOSKELETAL: Negative for joint pain or swelling, back pain, and muscle pain. SKIN: Negative for lesions, rash HEMATOLOGY Negative for prolonged bleeding, bruising easily, and swollen nodes. ENDOCRINE: Negative for cold or heat intolerance, polyuria, polydipsia and goiter. NEURO: negative for tremor, gait imbalance, syncope and seizures. The remainder of the review of systems is noncontributory.   Physical Exam: BP 115/75 (BP Location: Left Arm, Patient Position: Sitting, Cuff Size: Normal)   Pulse (!) 55   Temp 98.1 F (36.7 C) (Oral)   Ht 5\' 11"  (1.803 m)   Wt 205 lb 1.6 oz (93 kg)   BMI 28.61 kg/m  GENERAL: The patient is AO x3, in no acute distress. HEENT: Head is normocephalic and atraumatic. EOMI are intact. Mouth is well hydrated and without lesions. NECK: Supple. No masses LUNGS: Clear to auscultation. No presence of rhonchi/wheezing/rales. Adequate chest expansion HEART: RRR, normal s1 and s2. ABDOMEN: Soft, nontender, no guarding, no peritoneal signs, and nondistended. BS +. No masses.   Imaging/Labs: as above     Latest Ref Rng & Units 02/13/2023    3:30 PM  06/23/2008   12:10 PM 09/23/2007   10:31 AM  CBC  WBC 4.0 - 10.5 K/uL 11.6  8.1  7.7   Hemoglobin 13.0 - 17.0 g/dL 52.8  41.3  24.4   Hematocrit 39.0 - 52.0 % 35.3  43.2  42.6   Platelets 150 - 400 K/uL 252  298  283    No results found for: "IRON", "TIBC", "FERRITIN"  I personally reviewed and interpreted the available labs, imaging and endoscopic files.  Impression and Plan: Joshua Barr is a 57 y.o. male with bipolar disorder, hypertension, hypothyroidism, chronic uncontrolled GERD, chronic pain on opioids who presents for evaluation of Dysphagia   #Solid and Liquid food dysphagia   Patient has progressive dysphagia and given chronic GERD I cannot rule out sequela of chronic GERD such as Barrett's, esophagitis, stricture, given both liquid and solid food dysphagia this may also be motility disorder.  With esophageal biopsies and able to rule out eosinophilic esophagitis. at this time it is imperative to proceed with upper endoscopy plus and minus dilation/esophageal biopsies.  Aspiration precautions - Cut food in small pieces and chew food thoroughly to avoid regurgitation episodes. - Discussed avoidance of eating within 3 hours of lying down to sleep and benefit of blocks to elevate head of bed. Also, will benefit from avoiding carbonated drinks/sodas or food that has tomatoes, spicy or greasy food.   If EGD is normal patient may need high-resolution manometry after opioid cessation  #Normocytic Anemia Patient has new onset normocytic anemia hemoglobin 11.4 where his baseline has been 14  Patient is up-to-date to colonoscopy in 2022 with suggested repeat in 5 years.  Done by Dr. Karilyn Barr  Will obtain ferritin if found to be iron deficient may need small bowel capsule endoscopy+/- early colonoscopy  #Chronic Constipation  This is likely opioid-induced constipation  Ensure adequate fluid intake: Aim for 8 glasses of water daily. Follow a high fiber diet: Include foods such as  dates, prunes, pears, and kiwi. Take Miralax twice a day for the first week, then reduce to once daily thereafter. Use Metamucil twice a day. Check TSH with next blood work  All questions were answered.      Joshua Lawman, MD Gastroenterology and Hepatology [facility] Gastroenterology   This chart has been completed using [facility] Dictation software, and while attempts have been made to ensure accuracy , certain words and phrases may not be transcribed as intended

## 2023-02-18 NOTE — H&P (View-Only) (Signed)
Joshua Barr , M.D. Gastroenterology & Hepatology [facility] Gastroenterology [address] [place], [place] [address] Primary Care Physician: Toma Deiters, MD [address] [place] [address]  Chief Complaint:  Dysphagia   History of Present Illness: Joshua Barr is a 57 y.o. male with bipolar disorder, hypertension, hypothyroidism, chronic uncontrolled GERD, chronic pain on opioids who presents for evaluation of Dysphagia   Patient reports that last week he was having soda where he had sudden onset of chest discomfort and felt like the liquid was not moving down.  Patient reports he has been having solid liquid food dysphagia for few months but it has progressed to "liquid and solid past couple weeks.  Patient has been taking PPI for many years without complete relief. The patient denies having any nausea, vomiting, fever, chills, hematochezia, melena, hematemesis, abdominal distention, abdominal pain, diarrhea, jaundice, pruritus or weight loss.  Last UJW:JXBJ Last Colonoscopy: 2022 Dr Karilyn Cota  4 small polyps removed from his colon and they are all tubular adenomas  Next colonoscopy in 5 years.  Recent labs with hemoglobin 11.7 MCV 90 Platelet 256 normal liver enzymes  FHx: neg for any gastrointestinal/liver disease, no malignancies Social: neg smoking, alcohol or illicit drug use Surgical: no abdominal surgeries  Past Medical History: Past Medical History:  Diagnosis Date   Anxiety    Arthritis    Bipolar disorder (HCC)    GERD (gastroesophageal reflux disease)    Hypercholesteremia    Hypertension    Hypothyroidism     Past Surgical History: Past Surgical History:  Procedure Laterality Date   ANTERIOR CERVICAL DECOMP/DISCECTOMY FUSION     C6-C7   COLONOSCOPY WITH PROPOFOL N/A 09/13/2020   Procedure: COLONOSCOPY WITH PROPOFOL;  Surgeon: Malissa Hippo, MD;  [facility];  Service: Endoscopy;  Laterality:  N/A;  1050   EYE SURGERY     POLYPECTOMY  09/13/2020   Procedure: POLYPECTOMY;  Surgeon: Malissa Hippo, MD;  [facility]: [facility];  Service: Endoscopy;;    Family History:No family history on file.  Social History: Social History   Tobacco Use  Smoking Status Former   Types: Cigarettes   Passive exposure: Current  Smokeless Tobacco Never   Social History   Substance and Sexual Activity  Alcohol Use Not Currently   Social History   Substance and Sexual Activity  Drug Use Not Currently    Allergies: Allergies  Allergen Reactions   Topamax [Topiramate] Other (See Comments)    seizure    Medications: Current Outpatient Medications  Medication Sig Dispense Refill   busPIRone (BUSPAR) 15 MG tablet Take 30 mg by mouth 2 (two) times daily.     carboxymethylcellulose (REFRESH PLUS) 0.5 % SOLN Place 1 drop into both eyes daily as needed (dry eyes).     citalopram (CELEXA) 20 MG tablet Take 20 mg by mouth daily.     diclofenac Sodium (VOLTAREN) 1 % GEL Apply 1 application topically 4 (four) times daily as needed (pain).     fexofenadine (ALLEGRA) 180 MG tablet Take 180 mg by mouth daily.     fluticasone (FLONASE) 50 MCG/ACT nasal spray Place 1 spray into both nostrils daily.     gabapentin (NEURONTIN) 300 MG capsule Take 300 mg by mouth 4 (four) times daily.     gemfibrozil (LOPID) 600 MG tablet Take 600 mg by mouth 2 (two) times daily before a meal.     levothyroxine (SYNTHROID) 75 MCG tablet Take  75 mcg by mouth daily before breakfast.     lithium carbonate 300 MG capsule Take 300 mg by mouth daily.     losartan (COZAAR) 100 MG tablet Take 100 mg by mouth daily.     meloxicam (MOBIC) 15 MG tablet Take 1 tablet (15 mg total) by mouth daily.     Menthol, Topical Analgesic, (BIOFREEZE) 10 % LIQD Apply 1 application topically daily as needed (pain).     Omega-3 Fatty Acids (FISH OIL) 1000 MG CAPS Take 1,000 mg by mouth in the morning and at bedtime.     pantoprazole  (PROTONIX) 40 MG tablet Take 40 mg by mouth daily.     simvastatin (ZOCOR) 20 MG tablet Take 20 mg by mouth at bedtime.     sucralfate (CARAFATE) 1 g tablet Take 1 tablet (1 g total) by mouth 4 (four) times daily -  with meals and at bedtime. 30 tablet 0   tadalafil (CIALIS) 5 MG tablet Take 5 mg by mouth every other day.     traZODone (DESYREL) 100 MG tablet Take 50 mg by mouth at bedtime.     No current facility-administered medications for this visit.    Review of Systems: GENERAL: negative for malaise, night sweats HEENT: No changes in hearing or vision, no nose bleeds or other nasal problems. NECK: Negative for lumps, goiter, pain and significant neck swelling RESPIRATORY: Negative for cough, wheezing CARDIOVASCULAR: Negative for chest pain, leg swelling, palpitations, orthopnea GI: SEE HPI MUSCULOSKELETAL: Negative for joint pain or swelling, back pain, and muscle pain. SKIN: Negative for lesions, rash HEMATOLOGY Negative for prolonged bleeding, bruising easily, and swollen nodes. ENDOCRINE: Negative for cold or heat intolerance, polyuria, polydipsia and goiter. NEURO: negative for tremor, gait imbalance, syncope and seizures. The remainder of the review of systems is noncontributory.   Physical Exam: BP 115/75 (BP Location: Left Arm, Patient Position: Sitting, Cuff Size: Normal)   Pulse (!) 55   Temp 98.1 F (36.7 C) (Oral)   Ht 5\' 11"  (1.803 m)   Wt 205 lb 1.6 oz (93 kg)   BMI 28.61 kg/m  GENERAL: The patient is AO x3, in no acute distress. HEENT: Head is normocephalic and atraumatic. EOMI are intact. Mouth is well hydrated and without lesions. NECK: Supple. No masses LUNGS: Clear to auscultation. No presence of rhonchi/wheezing/rales. Adequate chest expansion HEART: RRR, normal s1 and s2. ABDOMEN: Soft, nontender, no guarding, no peritoneal signs, and nondistended. BS +. No masses.   Imaging/Labs: as above     Latest Ref Rng & Units 02/13/2023    3:30 PM  06/23/2008   12:10 PM 09/23/2007   10:31 AM  CBC  WBC 4.0 - 10.5 K/uL 11.6  8.1  7.7   Hemoglobin 13.0 - 17.0 g/dL 52.8  41.3  24.4   Hematocrit 39.0 - 52.0 % 35.3  43.2  42.6   Platelets 150 - 400 K/uL 252  298  283    No results found for: "IRON", "TIBC", "FERRITIN"  I personally reviewed and interpreted the available labs, imaging and endoscopic files.  Impression and Plan: Joshua Barr is a 57 y.o. male with bipolar disorder, hypertension, hypothyroidism, chronic uncontrolled GERD, chronic pain on opioids who presents for evaluation of Dysphagia   #Solid and Liquid food dysphagia   Patient has progressive dysphagia and given chronic GERD I cannot rule out sequela of chronic GERD such as Barrett's, esophagitis, stricture, given both liquid and solid food dysphagia this may also be motility disorder.  With esophageal biopsies and able to rule out eosinophilic esophagitis. at this time it is imperative to proceed with upper endoscopy plus and minus dilation/esophageal biopsies.  Aspiration precautions - Cut food in small pieces and chew food thoroughly to avoid regurgitation episodes. - Discussed avoidance of eating within 3 hours of lying down to sleep and benefit of blocks to elevate head of bed. Also, will benefit from avoiding carbonated drinks/sodas or food that has tomatoes, spicy or greasy food.   If EGD is normal patient may need high-resolution manometry after opioid cessation  #Normocytic Anemia Patient has new onset normocytic anemia hemoglobin 11.4 where his baseline has been 14  Patient is up-to-date to colonoscopy in 2022 with suggested repeat in 5 years.  Done by Dr. Karilyn Cota  Will obtain ferritin if found to be iron deficient may need small bowel capsule endoscopy+/- early colonoscopy  #Chronic Constipation  This is likely opioid-induced constipation  Ensure adequate fluid intake: Aim for 8 glasses of water daily. Follow a high fiber diet: Include foods such as  dates, prunes, pears, and kiwi. Take Miralax twice a day for the first week, then reduce to once daily thereafter. Use Metamucil twice a day. Check TSH with next blood work  All questions were answered.      Joshua Lawman, MD Gastroenterology and Hepatology [facility] Gastroenterology   This chart has been completed using [facility] Dictation software, and while attempts have been made to ensure accuracy , certain words and phrases may not be transcribed as intended

## 2023-02-18 NOTE — Patient Instructions (Signed)
It was very nice to meet you today, as dicussed with will plan for the following : 1) Upper endoscopy

## 2023-02-19 ENCOUNTER — Encounter (INDEPENDENT_AMBULATORY_CARE_PROVIDER_SITE_OTHER): Payer: Self-pay

## 2023-02-19 ENCOUNTER — Other Ambulatory Visit: Payer: Self-pay

## 2023-02-19 ENCOUNTER — Encounter (HOSPITAL_COMMUNITY)
Admission: RE | Admit: 2023-02-19 | Discharge: 2023-02-19 | Disposition: A | Discharge status: Home / Self Care | Payer: Medicare HMO | Source: Ambulatory Visit | Attending: Gastroenterology | Admitting: Gastroenterology

## 2023-02-19 ENCOUNTER — Encounter (HOSPITAL_COMMUNITY): Payer: Self-pay

## 2023-02-20 ENCOUNTER — Encounter (HOSPITAL_COMMUNITY)
Admission: RE | Disposition: A | Discharge status: Home / Self Care | Payer: Self-pay | Source: Home / Self Care | Attending: Gastroenterology

## 2023-02-20 ENCOUNTER — Encounter (HOSPITAL_COMMUNITY): Payer: Self-pay

## 2023-02-20 ENCOUNTER — Ambulatory Visit (HOSPITAL_COMMUNITY)
Admission: RE | Admit: 2023-02-20 | Discharge: 2023-02-20 | Disposition: A | Discharge status: Home / Self Care | Payer: Medicare HMO | Attending: Gastroenterology | Admitting: Gastroenterology

## 2023-02-20 ENCOUNTER — Encounter (INDEPENDENT_AMBULATORY_CARE_PROVIDER_SITE_OTHER): Payer: Self-pay | Admitting: *Deleted

## 2023-02-20 ENCOUNTER — Other Ambulatory Visit: Payer: Self-pay

## 2023-02-20 ENCOUNTER — Ambulatory Visit (HOSPITAL_COMMUNITY): Payer: Medicare HMO | Admitting: Anesthesiology

## 2023-02-20 DIAGNOSIS — K219 Gastro-esophageal reflux disease without esophagitis: Secondary | ICD-10-CM | POA: Insufficient documentation

## 2023-02-20 DIAGNOSIS — R1310 Dysphagia, unspecified: Secondary | ICD-10-CM | POA: Diagnosis present

## 2023-02-20 DIAGNOSIS — K5909 Other constipation: Secondary | ICD-10-CM | POA: Diagnosis not present

## 2023-02-20 DIAGNOSIS — B3781 Candidal esophagitis: Secondary | ICD-10-CM

## 2023-02-20 DIAGNOSIS — G8929 Other chronic pain: Secondary | ICD-10-CM | POA: Diagnosis not present

## 2023-02-20 DIAGNOSIS — I10 Essential (primary) hypertension: Secondary | ICD-10-CM | POA: Diagnosis not present

## 2023-02-20 DIAGNOSIS — Z87891 Personal history of nicotine dependence: Secondary | ICD-10-CM | POA: Diagnosis not present

## 2023-02-20 DIAGNOSIS — K229 Disease of esophagus, unspecified: Secondary | ICD-10-CM | POA: Diagnosis not present

## 2023-02-20 DIAGNOSIS — Z79891 Long term (current) use of opiate analgesic: Secondary | ICD-10-CM | POA: Insufficient documentation

## 2023-02-20 DIAGNOSIS — D649 Anemia, unspecified: Secondary | ICD-10-CM | POA: Insufficient documentation

## 2023-02-20 DIAGNOSIS — K3189 Other diseases of stomach and duodenum: Secondary | ICD-10-CM | POA: Insufficient documentation

## 2023-02-20 HISTORY — PX: ESOPHAGOGASTRODUODENOSCOPY (EGD) WITH PROPOFOL: SHX5813

## 2023-02-20 LAB — KOH PREP

## 2023-02-20 SURGERY — ESOPHAGOGASTRODUODENOSCOPY (EGD) WITH PROPOFOL
Anesthesia: General

## 2023-02-20 MED ORDER — PROPOFOL 10 MG/ML IV BOLUS
INTRAVENOUS | Status: DC | PRN
  Administered 2023-02-20 (×4): 50 mg via INTRAVENOUS

## 2023-02-20 MED ORDER — LACTATED RINGERS IV SOLN: INTRAVENOUS | Status: DC

## 2023-02-20 MED ORDER — FLUCONAZOLE 200 MG PO TABS: ORAL_TABLET | ORAL | 0 refills | Status: DC

## 2023-02-20 MED ORDER — LIDOCAINE HCL (CARDIAC) PF 100 MG/5ML IV SOSY
PREFILLED_SYRINGE | INTRAVENOUS | Status: DC | PRN
  Administered 2023-02-20: 50 mg via INTRAVENOUS

## 2023-02-20 NOTE — Op Note (Addendum)
[facility] Patient Name: Joshua Barr Procedure Date: 02/20/2023 8:13 AM MRN: [id number] Date of Birth: 29-Oct-1965 Attending MD: Sanjuan Dame , MD, [id number] CSN: [id number] Age: 57 Admit Type: Outpatient Procedure:                Upper GI endoscopy Indications:              Dysphagia Providers:                Sanjuan Dame, MD, Nena Polio, RN, Zena Amos Referring MD:              Medicines:                Monitored Anesthesia Care Complications:            No immediate complications. Estimated Blood Loss:     Estimated blood loss: none. Estimated blood loss                            was minimal. Procedure:                Pre-Anesthesia Assessment:                           - Prior to the procedure, a History and Physical                            was performed, and patient medications and                            allergies were reviewed. The patient's tolerance of                            previous anesthesia was also reviewed. The risks                            and benefits of the procedure and the sedation                            options and risks were discussed with the patient.                            All questions were answered, and informed consent                            was obtained. Prior Anticoagulants: The patient has                            taken no anticoagulant or antiplatelet agents                            except for aspirin. ASA Grade Assessment: II - A                            patient with mild systemic disease. After reviewing  the risks and benefits, the patient was deemed in                            satisfactory condition to undergo the procedure.                           After obtaining informed consent, the endoscope was                            passed under direct vision. Throughout the                            procedure, the patient's blood pressure, pulse, and                             oxygen saturations were monitored continuously. The                            GIF-H190 ([id number]) scope was introduced through the                            mouth, and advanced to the second part of duodenum.                            The upper GI endoscopy was accomplished without                            difficulty. The patient tolerated the procedure                            well. Scope In: 8:27:56 AM Scope Out: 8:35:33 AM Total Procedure Duration: 0 hours 7 minutes 37 seconds  Findings:      Diffuse, white plaques were found in the entire esophagus. Biopsies were       taken with a cold forceps for histology. Brushings for KOH prep were       obtained.      Mildly erythematous mucosa without bleeding was found in the stomach.       Biopsies were taken with a cold forceps for histology.      The duodenal bulb and second portion of the duodenum were normal. Impression:               - Esophageal plaques were found, suspicious for                            candidiasis. Biopsied. Brushings performed.                           - Erythematous mucosa in the stomach. Biopsied.                           - Normal duodenal bulb and second portion of the                            duodenum. Moderate Sedation:  Per Anesthesia Care Recommendation:           - Patient has a contact number available for                            emergencies. The signs and symptoms of potential                            delayed complications were discussed with the                            patient. Return to normal activities tomorrow.                            Written discharge instructions were provided to the                            patient.                           - Resume previous diet.                           - Continue present medications. emperically start                            Fluconazole                           - Await pathology results.                           -  Repeat upper endoscopy in 2 months to check                            healing and to evaluate the response to therapy.                            Also to assess for underlying Barretts esophagus                           - Return to GI clinic as previously scheduled.                           -If positive for Candida, consider checking HIV                           -While taking Fluconazole , take Simvastatin every                            2-3 days rather than taking it daily Procedure Code(s):        --- Professional ---                           [phone], Esophagogastroduodenoscopy, flexible,  transoral; with biopsy, single or multiple Diagnosis Code(s):        --- Professional ---                           K22.9, Disease of esophagus, unspecified                           K31.89, Other diseases of stomach and duodenum                           R13.10, Dysphagia, unspecified CPT copyright 2022 [employer]. All rights reserved. The codes documented in this report are preliminary and upon coder review may  be revised to meet current compliance requirements. Sanjuan Dame, MD Sanjuan Dame, MD 02/20/2023 8:40:42 AM This report has been signed electronically. Number of Addenda: 0

## 2023-02-20 NOTE — Interval H&P Note (Signed)
History and Physical Interval Note:  02/20/2023 8:01 AM  [name]  has presented today for surgery, with the diagnosis of DYSPHAGIA.  The various methods of treatment have been discussed with the patient and family. After consideration of risks, benefits and other options for treatment, the patient has consented to  Procedure(s) with comments: ESOPHAGOGASTRODUODENOSCOPY (EGD) WITH PROPOFOL (N/A) - 9:25AM;ASA 1-2 as a surgical intervention.  The patient's history has been reviewed, patient examined, no change in status, stable for surgery.  I have reviewed the patient's chart and labs.  Questions were answered to the patient's satisfaction.     Joshua Barr

## 2023-02-20 NOTE — Progress Notes (Signed)
Hi Wendy ,  Can you please call the patient and tell the patient , preliminary report does suggest yeast infection in the esophagus . Recommend fluconazole medication as prescribed   Thanks,  [name], MD Gastroenterology and Hepatology [facility] Gastroenterology

## 2023-02-20 NOTE — Anesthesia Preprocedure Evaluation (Signed)
Anesthesia Evaluation  Patient identified by MRN, date of birth, ID band Patient awake    Reviewed: Allergy & Precautions, H&P , NPO status , Patient's Chart, lab work & pertinent test results, reviewed documented beta blocker date and time   Airway Mallampati: II  TM Distance: >3 FB Neck ROM: full    Dental no notable dental hx.    Pulmonary neg pulmonary ROS, former smoker   Pulmonary exam normal breath sounds clear to auscultation       Cardiovascular Exercise Tolerance: Good hypertension, negative cardio ROS  Rhythm:regular Rate:Normal     Neuro/Psych  PSYCHIATRIC DISORDERS Anxiety  Bipolar Disorder   negative neurological ROS  negative psych ROS   GI/Hepatic negative GI ROS, Neg liver ROS,GERD  ,,  Endo/Other  negative endocrine ROSHypothyroidism    Renal/GU negative Renal ROS  negative genitourinary   Musculoskeletal  (+) Arthritis ,    Abdominal   Peds  Hematology negative hematology ROS (+) Blood dyscrasia, anemia   Anesthesia Other Findings   Reproductive/Obstetrics negative OB ROS                             Anesthesia Physical Anesthesia Plan  ASA: 3  Anesthesia Plan: General   Post-op Pain Management:    Induction:   PONV Risk Score and Plan: Propofol infusion  Airway Management Planned:   Additional Equipment:   Intra-op Plan:   Post-operative Plan:   Informed Consent: I have reviewed the patients History and Physical, chart, labs and discussed the procedure including the risks, benefits and alternatives for the proposed anesthesia with the patient or authorized representative who has indicated his/her understanding and acceptance.     Dental Advisory Given  Plan Discussed with: CRNA  Anesthesia Plan Comments:        Anesthesia Quick Evaluation

## 2023-02-20 NOTE — Transfer of Care (Signed)
Immediate Anesthesia Transfer of Care Note  Patient: Joshua Barr  Procedure(s) Performed: ESOPHAGOGASTRODUODENOSCOPY (EGD) WITH PROPOFOL  Patient Location: Short Stay  Anesthesia Type:General  Level of Consciousness: awake, alert , oriented, and patient cooperative  Airway & Oxygen Therapy: Patient Spontanous Breathing  Post-op Assessment: Report given to RN, Post -op Vital signs reviewed and stable, and Patient moving all extremities X 4  Post vital signs: Reviewed and stable  Last Vitals:  Vitals Value Taken Time  BP 89/52 02/20/23 0843  Temp 36.5 C 02/20/23 0843  Pulse 48 02/20/23 0843  Resp 18 02/20/23 0843  SpO2 94 % 02/20/23 0843    Last Pain:  Vitals:   02/20/23 0843  TempSrc: Axillary  PainSc: 0-No pain         Complications: No notable events documented.

## 2023-02-20 NOTE — Discharge Instructions (Signed)
  Discharge instructions Please read the instructions outlined below and refer to this sheet in the next few weeks. These discharge instructions provide you with general information on caring for yourself after you leave the hospital. Your doctor may also give you specific instructions. While your treatment has been planned according to the most current medical practices available, unavoidable complications occasionally occur. If you have any problems or questions after discharge, please call your doctor. ACTIVITY You may resume your regular activity but move at a slower pace for the next 24 hours.  Take frequent rest periods for the next 24 hours.  Walking will help expel (get rid of) the air and reduce the bloated feeling in your abdomen.  No driving for 24 hours (because of the anesthesia (medicine) used during the test).  You may shower.  Do not sign any important legal documents or operate any machinery for 24 hours (because of the anesthesia used during the test).  NUTRITION Drink plenty of fluids.  You may resume your normal diet.  Begin with a light meal and progress to your normal diet.  Avoid alcoholic beverages for 24 hours or as instructed by your caregiver.  MEDICATIONS You may resume your normal medications unless your caregiver tells you otherwise.  WHAT YOU CAN EXPECT TODAY You may experience abdominal discomfort such as a feeling of fullness or "gas" pains.  FOLLOW-UP Your doctor will discuss the results of your test with you.  SEEK IMMEDIATE MEDICAL ATTENTION IF ANY OF THE FOLLOWING OCCUR: Excessive nausea (feeling sick to your stomach) and/or vomiting.  Severe abdominal pain and distention (swelling).  Trouble swallowing.  Temperature over 101 F (37.8 C).  Rectal bleeding or vomiting of blood.    While taking Fluconazole , take Simvastatin every 2-3 days rather than taking it daily    I hope you have a great rest of your week!   [name] ,  M.D.. Gastroenterology and Hepatology [facility]

## 2023-02-20 NOTE — Anesthesia Postprocedure Evaluation (Signed)
Anesthesia Post Note  Patient: Joshua Barr  Procedure(s) Performed: ESOPHAGOGASTRODUODENOSCOPY (EGD) WITH PROPOFOL  Patient location during evaluation: Phase II Anesthesia Type: General Level of consciousness: awake Pain management: pain level controlled Vital Signs Assessment: post-procedure vital signs reviewed and stable Respiratory status: spontaneous breathing and respiratory function stable Cardiovascular status: blood pressure returned to baseline and stable Postop Assessment: no headache and no apparent nausea or vomiting Anesthetic complications: no Comments: Late entry   No notable events documented.   Last Vitals:  Vitals:   02/20/23 0849 02/20/23 0856  BP: 96/69 (!) 122/90  Pulse:    Resp:    Temp:    SpO2:      Last Pain:  Vitals:   02/20/23 0843  TempSrc: Axillary  PainSc: 0-No pain                 Windell Norfolk

## 2023-02-21 LAB — SURGICAL PATHOLOGY

## 2023-02-24 NOTE — Progress Notes (Signed)
I reviewed the pathology results. Ann, can you send her a letter with the findings as described below please?  Repeat upper endoscopy in 2 months .  Please also send message to PCP to consider checking HIV with next blood work as patient had severe candida esophagitis despite he is not using steroids .  Thanks,  [name], MD Gastroenterology and Hepatology [facility]  ---------------------------------------------------------------------------------------------  [facility] [address], [place], [place] [address] Phone:  [phone]   02/24/23 [place], [place]   Dear Joshua Barr,  I am writing to inform you that the biopsies taken during your recent endoscopic examination showed:  Upper endoscopy showed Candida esophagitis which is a fungal infection in the esophagus , this explains your symptoms.   Recommend :   -Complete course of fluconazole for 21 days which was prescribed  -Repeat upper endoscopy in 2 months to check healing and to evaluate the response to therapy. Also to assess for underlying Barretts esophagus  -While taking Fluconazole , take Simvastatin every 2- 3 days rather than taking it daily  Also I value your feedback , so if you get a survey , please take the time to fill it out and thank you for choosing [facility]  Please call us at [phone] if you have persistent problems or have questions about your condition that have not been fully answered at this time.  Sincerely,  [name], MD Gastroenterology and Hepatology

## 2023-02-27 ENCOUNTER — Encounter (INDEPENDENT_AMBULATORY_CARE_PROVIDER_SITE_OTHER): Payer: Self-pay | Admitting: *Deleted

## 2023-02-28 ENCOUNTER — Encounter (HOSPITAL_COMMUNITY): Payer: Self-pay | Admitting: Gastroenterology

## 2023-03-21 ENCOUNTER — Encounter (INDEPENDENT_AMBULATORY_CARE_PROVIDER_SITE_OTHER): Payer: Self-pay | Admitting: *Deleted

## 2023-04-17 ENCOUNTER — Telehealth (INDEPENDENT_AMBULATORY_CARE_PROVIDER_SITE_OTHER): Payer: Self-pay | Admitting: Gastroenterology

## 2023-04-17 NOTE — Telephone Encounter (Signed)
Ok to schedule.  Room 1/2  Thanks,  [name], MD Gastroenterology and Hepatology [facility]

## 2023-04-17 NOTE — Telephone Encounter (Signed)
Who is your primary care physician: Lia Hopping  Are you diabetic? If yes, Type 1 or Type 2?    no  Do you have a prosthetic or mechanical heart valve? no  Do you have a pacemaker/defibrillator?   no  Have you had endocarditis/atrial fibrillation? no  Have you had joint replacement within the last 12 months?  no  Do you have any history of drugs or alchohol?  Yes over 20 years ago  Do you use supplemental oxygen?  no  Have you had a stroke or heart attack within the last 6 months? no  Do you take weight loss medication?  no  Do you take any blood-thinning medications such as: (aspirin, warfarin, Plavix, Aggrenox)    If yes we need the name, milligram, dosage and who is prescribing doctor Fish Oil 1000mg  2 daily Current Outpatient Medications on File Prior to Visit  Medication Sig Dispense Refill   baclofen (LIORESAL) 10 MG tablet Take by mouth.     busPIRone (BUSPAR) 15 MG tablet Take 30 mg by mouth 2 (two) times daily.     carboxymethylcellulose (REFRESH PLUS) 0.5 % SOLN Place 1 drop into both eyes daily as needed (dry eyes). (Patient not taking: Reported on 04/17/2023)     citalopram (CELEXA) 20 MG tablet Take 20 mg by mouth daily.     fexofenadine (ALLEGRA) 180 MG tablet Take 180 mg by mouth daily.     fluconazole (DIFLUCAN) 200 MG tablet Take 2 tablets on day 1 then 1 tablet daily thereafter for a total of 21 days. (Patient not taking: Reported on 04/17/2023) 23 tablet 0   fluticasone (FLONASE) 50 MCG/ACT nasal spray Place 1 spray into both nostrils daily.     gabapentin (NEURONTIN) 300 MG capsule Take 300 mg by mouth 4 (four) times daily.     gemfibrozil (LOPID) 600 MG tablet Take 600 mg by mouth 2 (two) times daily before a meal.     HYDROcodone-acetaminophen (NORCO/VICODIN) 5-325 MG tablet Take 1 tablet by mouth 3 (three) times daily.     levothyroxine (SYNTHROID) 75 MCG tablet Take 75 mcg by mouth daily before breakfast.     lithium carbonate 300 MG capsule Take 300 mg by  mouth daily.     losartan (COZAAR) 100 MG tablet Take 100 mg by mouth daily.     meloxicam (MOBIC) 15 MG tablet Take 1 tablet (15 mg total) by mouth daily.     Menthol, Topical Analgesic, (BIOFREEZE) 10 % LIQD Apply 1 application topically daily as needed (pain). (Patient not taking: Reported on 04/17/2023)     Omega-3 Fatty Acids (FISH OIL) 1000 MG CAPS Take 1,000 mg by mouth in the morning and at bedtime.     pantoprazole (PROTONIX) 40 MG tablet Take 40 mg by mouth daily.     polyethylene glycol (MIRALAX / GLYCOLAX) 17 g packet Take 17 g by mouth 2 (two) times daily. (Patient not taking: Reported on 04/17/2023) 180 packet 0   psyllium (METAMUCIL) 58.6 % packet Take 1 packet by mouth 2 (two) times daily. (Patient not taking: Reported on 04/17/2023) 60 packet 2   simvastatin (ZOCOR) 20 MG tablet Take 20 mg by mouth at bedtime.     sucralfate (CARAFATE) 1 g tablet Take 1 tablet (1 g total) by mouth 4 (four) times daily -  with meals and at bedtime. (Patient not taking: Reported on 04/17/2023) 30 tablet 0   tadalafil (CIALIS) 5 MG tablet Take 5 mg by mouth every other day.  traZODone (DESYREL) 100 MG tablet Take 50 mg by mouth at bedtime.     No current facility-administered medications on file prior to visit.    Allergies  Allergen Reactions   Topamax [Topiramate] Other (See Comments)    seizure     Pharmacy:   Primary Insurance Name: [employer]  Best number where you can be reached: [phone]

## 2023-04-29 NOTE — Telephone Encounter (Signed)
Pt contacted and scheduled for 05/07/23. Instructions sent via my chart. No pa needed per insurance.

## 2023-04-29 NOTE — Telephone Encounter (Signed)
Questionnaire from recall, no referral needed

## 2023-05-07 ENCOUNTER — Ambulatory Visit (HOSPITAL_COMMUNITY): Payer: Medicare HMO | Admitting: Anesthesiology

## 2023-05-07 ENCOUNTER — Encounter (HOSPITAL_COMMUNITY)
Admission: RE | Disposition: A | Discharge status: Home / Self Care | Payer: Self-pay | Source: Home / Self Care | Attending: Gastroenterology

## 2023-05-07 ENCOUNTER — Encounter (HOSPITAL_COMMUNITY): Payer: Self-pay | Admitting: Gastroenterology

## 2023-05-07 ENCOUNTER — Other Ambulatory Visit: Payer: Self-pay

## 2023-05-07 ENCOUNTER — Ambulatory Visit (HOSPITAL_COMMUNITY)
Admission: RE | Admit: 2023-05-07 | Discharge: 2023-05-07 | Disposition: A | Discharge status: Home / Self Care | Payer: Medicare HMO | Attending: Gastroenterology | Admitting: Gastroenterology

## 2023-05-07 ENCOUNTER — Telehealth (INDEPENDENT_AMBULATORY_CARE_PROVIDER_SITE_OTHER): Payer: Self-pay | Admitting: Gastroenterology

## 2023-05-07 DIAGNOSIS — I10 Essential (primary) hypertension: Secondary | ICD-10-CM | POA: Diagnosis not present

## 2023-05-07 DIAGNOSIS — Z8619 Personal history of other infectious and parasitic diseases: Secondary | ICD-10-CM | POA: Insufficient documentation

## 2023-05-07 DIAGNOSIS — R1310 Dysphagia, unspecified: Secondary | ICD-10-CM | POA: Diagnosis present

## 2023-05-07 DIAGNOSIS — K2100 Gastro-esophageal reflux disease with esophagitis, without bleeding: Secondary | ICD-10-CM | POA: Diagnosis not present

## 2023-05-07 DIAGNOSIS — Z87891 Personal history of nicotine dependence: Secondary | ICD-10-CM | POA: Diagnosis not present

## 2023-05-07 DIAGNOSIS — F319 Bipolar disorder, unspecified: Secondary | ICD-10-CM | POA: Insufficient documentation

## 2023-05-07 DIAGNOSIS — E039 Hypothyroidism, unspecified: Secondary | ICD-10-CM | POA: Insufficient documentation

## 2023-05-07 DIAGNOSIS — F419 Anxiety disorder, unspecified: Secondary | ICD-10-CM | POA: Insufficient documentation

## 2023-05-07 DIAGNOSIS — K209 Esophagitis, unspecified without bleeding: Secondary | ICD-10-CM

## 2023-05-07 DIAGNOSIS — R1319 Other dysphagia: Secondary | ICD-10-CM

## 2023-05-07 HISTORY — PX: ESOPHAGOGASTRODUODENOSCOPY (EGD) WITH PROPOFOL: SHX5813

## 2023-05-07 HISTORY — DX: Personal history of urinary calculi: Z87.442

## 2023-05-07 HISTORY — PX: BIOPSY: SHX5522

## 2023-05-07 HISTORY — PX: SAVORY DILATION: SHX5439

## 2023-05-07 SURGERY — ESOPHAGOGASTRODUODENOSCOPY (EGD) WITH PROPOFOL
Anesthesia: General

## 2023-05-07 MED ORDER — LACTATED RINGERS IV SOLN: INTRAVENOUS | Status: DC

## 2023-05-07 MED ORDER — PROPOFOL 500 MG/50ML IV EMUL
INTRAVENOUS | Status: DC | PRN
  Administered 2023-05-07: 150 ug/kg/min via INTRAVENOUS

## 2023-05-07 MED ORDER — LIDOCAINE HCL (PF) 2 % IJ SOLN
INTRAMUSCULAR | Status: DC | PRN
  Administered 2023-05-07: 50 mg via INTRADERMAL

## 2023-05-07 MED ORDER — EPHEDRINE 5 MG/ML INJ
INTRAVENOUS | Status: AC
  Filled 2023-05-07: qty 5

## 2023-05-07 MED ORDER — EPHEDRINE SULFATE-NACL 50-0.9 MG/10ML-% IV SOSY
PREFILLED_SYRINGE | INTRAVENOUS | Status: DC | PRN
Start: 2023-05-07 — End: 2023-05-07
  Administered 2023-05-07: 5 mg via INTRAVENOUS

## 2023-05-07 MED ORDER — OMEPRAZOLE 40 MG PO CPDR: 40.0000 mg | DELAYED_RELEASE_CAPSULE | Freq: Every day | ORAL | 3 refills | Status: AC

## 2023-05-07 MED ORDER — PROPOFOL 10 MG/ML IV BOLUS
INTRAVENOUS | Status: DC | PRN
  Administered 2023-05-07: 150 mg via INTRAVENOUS

## 2023-05-07 NOTE — Discharge Instructions (Addendum)
  Discharge instructions Please read the instructions outlined below and refer to this sheet in the next few weeks. These discharge instructions provide you with general information on caring for yourself after you leave the hospital. Your doctor may also give you specific instructions. While your treatment has been planned according to the most current medical practices available, unavoidable complications occasionally occur. If you have any problems or questions after discharge, please call your doctor. ACTIVITY You may resume your regular activity but move at a slower pace for the next 24 hours.  Take frequent rest periods for the next 24 hours.  Walking will help expel (get rid of) the air and reduce the bloated feeling in your abdomen.  No driving for 24 hours (because of the anesthesia (medicine) used during the test).  You may shower.  Do not sign any important legal documents or operate any machinery for 24 hours (because of the anesthesia used during the test).  NUTRITION Drink plenty of fluids.  You may resume your normal diet.  Begin with a light meal and progress to your normal diet.  Avoid alcoholic beverages for 24 hours or as instructed by your caregiver.  MEDICATIONS You may resume your normal medications unless your caregiver tells you otherwise.  WHAT YOU CAN EXPECT TODAY You may experience abdominal discomfort such as a feeling of fullness or "gas" pains.  FOLLOW-UP Your doctor will discuss the results of your test with you.  SEEK IMMEDIATE MEDICAL ATTENTION IF ANY OF THE FOLLOWING OCCUR: Excessive nausea (feeling sick to your stomach) and/or vomiting.  Severe abdominal pain and distention (swelling).  Trouble swallowing.  Temperature over 101 F (37.8 C).  Rectal bleeding or vomiting of blood.    Start omeprazole 40mg  , 30 min before breakfast Obtain Esophagram.  Office will notify you.   I hope you have a great rest of your week!   [name] ,  M.D.. Gastroenterology and Hepatology [facility]

## 2023-05-07 NOTE — Transfer of Care (Signed)
 Immediate Anesthesia Transfer of Care Note  Patient: Joshua Barr  Procedure(s) Performed: ESOPHAGOGASTRODUODENOSCOPY (EGD) WITH PROPOFOL BIOPSY SAVORY DILATION  Patient Location: Endoscopy Unit  Anesthesia Type:General  Level of Consciousness: awake, alert , and oriented  Airway & Oxygen Therapy: Patient Spontanous Breathing  Post-op Assessment: Report given to RN, Post -op Vital signs reviewed and stable, Patient moving all extremities X 4, and Patient able to stick tongue midline  Post vital signs: Reviewed and stable  Last Vitals:  Vitals Value Taken Time  BP 110/55 05/07/23 1140  Temp 36.8 C 05/07/23 1140  Pulse 63 05/07/23 1140  Resp 16 05/07/23 1140  SpO2 99 % 05/07/23 1140    Last Pain:  Vitals:   05/07/23 1140  TempSrc: Oral  PainSc: 0-No pain      Patients Stated Pain Goal: 4 (05/07/23 0924)  Complications: No notable events documented.

## 2023-05-07 NOTE — Telephone Encounter (Signed)
 Vaught, Lilyan Punt, RN  Marlowe Shores, LPN Needs to be scheduled for an esophagram

## 2023-05-07 NOTE — Op Note (Signed)
 [facility] Patient Name: Joshua Barr Procedure Date: 05/07/2023 11:17 AM MRN: [id number] Date of Birth: [birth year] Attending MD: Sanjuan Dame , MD, [id number] CSN: [id number] Age: 58 Admit Type: Outpatient Procedure:                Upper GI endoscopy Indications:              Dysphagia, Exclusion of Barrett's esophagus,                            Follow-up of candidal esophagitis Providers:                Sanjuan Dame, MD, Crystal Page, Francoise Ceo RN,                            RN, Sheran Fava, Kristine L. Jessee Avers,                            Technician, Elinor Parkinson Referring MD:              Medicines:                Monitored Anesthesia Care Complications:            No immediate complications. Estimated Blood Loss:     Estimated blood loss was minimal. Procedure:                Pre-Anesthesia Assessment:                           - Prior to the procedure, a History and Physical                            was performed, and patient medications and                            allergies were reviewed. The patient's tolerance of                            previous anesthesia was also reviewed. The risks                            and benefits of the procedure and the sedation                            options and risks were discussed with the patient.                            All questions were answered, and informed consent                            was obtained. Prior Anticoagulants: The patient has                            taken no anticoagulant or antiplatelet agents. ASA  Grade Assessment: III - A patient with severe                            systemic disease. After reviewing the risks and                            benefits, the patient was deemed in satisfactory                            condition to undergo the procedure.                           After obtaining informed consent, the endoscope was                             passed under direct vision. Throughout the                            procedure, the patient's blood pressure, pulse, and                            oxygen saturations were monitored continuously. The                            [id number]-[id number] ([id number]) scope was introduced through the                            mouth, and advanced to the second part of duodenum.                            The upper GI endoscopy was accomplished without                            difficulty. The patient tolerated the procedure                            well. Scope In: 11:28:59 AM Scope Out: 11:36:36 AM Total Procedure Duration: 0 hours 7 minutes 37 seconds  Findings:      No endoscopic abnormality was evident in the esophagus to explain the       patient's complaint of dysphagia. It was decided, however, to proceed       with dilation of the entire esophagus. A guidewire was placed and the       scope was withdrawn. Dilation was performed with a Savary dilator with       mild resistance at 15 mm. The dilation site was examined following       endoscope reinsertion and showed mild mucosal disruption. Biopsies were       obtained from the proximal and distal esophagus with cold forceps for       histology of suspected eosinophilic esophagitis.      The entire examined stomach was normal.      The duodenal bulb and second portion of the duodenum were normal. Impression:               - No endoscopic esophageal abnormality  to explain                            patient's dysphagia. Esophagus dilated. Dilated.                           - Normal stomach.                           - Normal duodenal bulb and second portion of the                            duodenum.                           - Biopsies were taken with a cold forceps for                            evaluation of eosinophilic esophagitis. Moderate Sedation:      Per Anesthesia Care Recommendation:           -Dysphagia could be due to cervical  osteophyte and                            instrumentation , obtain esophagram                           - Patient has a contact number available for                            emergencies. The signs and symptoms of potential                            delayed complications were discussed with the                            patient. Return to normal activities tomorrow.                            Written discharge instructions were provided to the                            patient.                           - Resume previous diet.                           - Continue present medications.                           - Await pathology results.                           -Start omeprazole Procedure Code(s):        --- Professional ---  [phone], Esophagogastroduodenoscopy, flexible,                            transoral; with insertion of guide wire followed by                            passage of dilator(s) through esophagus over guide                            wire                           [id number], 59, Esophagogastroduodenoscopy, flexible,                            transoral; with biopsy, single or multiple Diagnosis Code(s):        --- Professional ---                           R13.10, Dysphagia, unspecified                           B37.81, Candidal esophagitis CPT copyright 2022 [employer]. All rights reserved. The codes documented in this report are preliminary and upon coder review may  be revised to meet current compliance requirements. Sanjuan Dame, MD Sanjuan Dame, MD 05/07/2023 11:43:12 AM This report has been signed electronically. Number of Addenda: 0

## 2023-05-07 NOTE — H&P (Signed)
 Primary Care Physician:  Toma Deiters, MD Primary Gastroenterologist:  Dr. Tasia Catchings  Pre-Procedure History & Physical: HPI:  Joshua Barr is a 58 y.o. male is here for a upper endoscopy given Severe esophagitis, canadida esophagitis , assess for underlying Barretts esophagus and response to treatement as he had EGD 02/2023 found to have severe candida esophagitis   Patient denies any family history of colorectal cancer.  No melena or hematochezia.  No abdominal pain or unintentional weight loss.  No change in bowel habits.  Overall feels well from a GI standpoint.  Last colonoscopy with Rehman 2022, due 5 years from that time ( 2027)  Past Medical History:  Diagnosis Date   Anxiety    Arthritis    Bipolar disorder (HCC)    GERD (gastroesophageal reflux disease)    History of kidney stones    Hypercholesteremia    Hypertension    Hypothyroidism     Past Surgical History:  Procedure Laterality Date   ANTERIOR CERVICAL DECOMP/DISCECTOMY FUSION     C6-C7   COLONOSCOPY WITH PROPOFOL N/A 09/13/2020   Procedure: COLONOSCOPY WITH PROPOFOL;  Surgeon: Malissa Hippo, MD;  [facility]: [facility];  Service: Endoscopy;  Laterality: N/A;  1050   ESOPHAGOGASTRODUODENOSCOPY (EGD) WITH PROPOFOL N/A 02/20/2023   Procedure: ESOPHAGOGASTRODUODENOSCOPY (EGD) WITH PROPOFOL;  Surgeon: Franky Macho, MD;  Location: [facility];  Service: Endoscopy;  Laterality: N/A;  9:25AM;ASA 1-2   EYE SURGERY Bilateral    cataract removal   POLYPECTOMY  09/13/2020   Procedure: POLYPECTOMY;  Surgeon: Malissa Hippo, MD;  Location: [facility];  Service: Endoscopy;;   ROTATOR CUFF REPAIR Left    and torn biceps   TRIGGER FINGER RELEASE Right     Prior to Admission medications   Medication Sig Start Date End Date Taking? Authorizing Provider  baclofen (LIORESAL) 10 MG tablet Take by mouth. 03/31/14  Yes [provider]  busPIRone (BUSPAR) 15 MG tablet Take 30 mg by mouth 2 (two) times  daily.   Yes [provider]  carboxymethylcellulose (REFRESH PLUS) 0.5 % SOLN Place 1 drop into both eyes daily as needed (dry eyes).   Yes [provider]  citalopram (CELEXA) 20 MG tablet Take 20 mg by mouth daily.   Yes [provider]  fexofenadine (ALLEGRA) 180 MG tablet Take 180 mg by mouth daily.   Yes [provider]  fluconazole (DIFLUCAN) 200 MG tablet Take 2 tablets on day 1 then 1 tablet daily thereafter for a total of 21 days. 02/20/23  Yes Vasilios Ottaway, Juanetta Beets, MD  fluticasone [facility]) 50 MCG/ACT nasal spray Place 1 spray into both nostrils daily.   Yes [provider]  gabapentin (NEURONTIN) 300 MG capsule Take 300 mg by mouth 4 (four) times daily.   Yes [provider]  gemfibrozil (LOPID) 600 MG tablet Take 600 mg by mouth 2 (two) times daily before a meal.   Yes [provider]  HYDROcodone-acetaminophen (NORCO/VICODIN) 5-325 MG tablet Take 1 tablet by mouth 3 (three) times daily.   Yes [provider]  levothyroxine (SYNTHROID) 75 MCG tablet Take 75 mcg by mouth daily before breakfast.   Yes [provider]  lithium carbonate 300 MG capsule Take 300 mg by mouth daily. 08/13/20  Yes [provider]  losartan (COZAAR) 100 MG tablet Take 100 mg by mouth daily.   Yes [provider]  meloxicam (MOBIC) 15 MG tablet Take 1 tablet (15 mg total) by mouth daily.  09/14/20  Yes Rehman, Joline Maxcy, MD  Menthol, Topical Analgesic, (BIOFREEZE) 10 % LIQD Apply 1 application  topically daily as needed (pain).   Yes [provider]  Omega-3 Fatty Acids (FISH OIL) 1000 MG CAPS Take 1,000 mg by mouth in the morning and at bedtime.   Yes [provider]  pantoprazole (PROTONIX) 40 MG tablet Take 40 mg by mouth daily.   Yes [provider]  simvastatin (ZOCOR) 20 MG tablet Take 20 mg by mouth at bedtime.   Yes [provider]  traZODone (DESYREL) 100 MG tablet Take 50 mg by  mouth at bedtime.   Yes [provider]  polyethylene glycol (MIRALAX / GLYCOLAX) 17 g packet Take 17 g by mouth 2 (two) times daily. Patient not taking: Reported on 04/17/2023 02/18/23 05/19/23  Franky Macho, MD  psyllium (METAMUCIL) 58.6 % packet Take 1 packet by mouth 2 (two) times daily. Patient not taking: Reported on 04/17/2023 02/18/23 05/19/23  Franky Macho, MD  sucralfate (CARAFATE) 1 g tablet Take 1 tablet (1 g total) by mouth 4 (four) times daily -  with meals and at bedtime. Patient not taking: Reported on 04/17/2023 02/13/23   Burgess Amor, PA-C  tadalafil (CIALIS) 5 MG tablet Take 5 mg by mouth every other day.    [provider]    Allergies as of 04/29/2023 - Review Complete 04/17/2023  Allergen Reaction Noted   Topamax [topiramate] Other (See Comments) 09/13/2020    History reviewed. No pertinent family history.  Social History   Socioeconomic History   Marital status: Divorced    Spouse name: Not on file   Number of children: Not on file   Years of education: Not on file   Highest education level: Not on file  Occupational History   Not on file  Tobacco Use   Smoking status: Former    Types: Cigarettes    Passive exposure: Current   Smokeless tobacco: Never  Vaping Use   Vaping status: Former  Substance and Sexual Activity   Alcohol use: Yes    Comment: rarely   Drug use: Not Currently   Sexual activity: Not on file  Other Topics Concern   Not on file  Social History Narrative   Not on file   Social Drivers of Health   Financial Resource Strain: Not on file  Food Insecurity: Not on file  Transportation Needs: Not on file  Physical Activity: Not on file  Stress: Not on file  Social Connections: Not on file  Intimate Partner Violence: Not on file    Review of Systems: See HPI, otherwise negative ROS  Physical Exam: Vital signs in last 24 hours: Temp:  [98.1 F (36.7 C)] 98.1 F (36.7 C) (02/19 0934) Pulse Rate:  [46] 46  (02/19 0934) Resp:  [15] 15 (02/19 0934) BP: (148)/(74) 148/74 (02/19 0934) SpO2:  [100 %] 100 % (02/19 0934) Weight:  [86.6 kg] 86.6 kg (02/19 0924)   General:   Alert,  Well-developed, well-nourished, pleasant and cooperative in NAD Head:  Normocephalic and atraumatic. Eyes:  Sclera clear, no icterus.   Conjunctiva pink. Ears:  Normal auditory acuity. Nose:  No deformity, discharge,  or lesions. Msk:  Symmetrical without gross deformities. Normal posture. Extremities:  Without clubbing or edema. Neurologic:  Alert and  oriented x4;  grossly normal neurologically. Skin:  Intact without significant lesions or rashes. Psych:  Alert and cooperative. Normal mood and affect.  Impression/Plan: Joshua Barr is a 58  y.o. male is here for a upper endoscopy given Severe esophagitis, canadida esophagitis , assess for underlying Barretts esophagus and response to treatement as he had EGD 02/2023 found to have severe candida esophagitis   The risks of the procedure including infection, bleed, or perforation as well as benefits, limitations, alternatives and imponderables have been reviewed with the patient. Questions have been answered. All parties agreeable.

## 2023-05-07 NOTE — Anesthesia Preprocedure Evaluation (Signed)
 Anesthesia Evaluation  Patient identified by MRN, date of birth, ID band Patient awake    Reviewed: Allergy & Precautions, H&P , NPO status , Patient's Chart, lab work & pertinent test results, reviewed documented beta blocker date and time   Airway Mallampati: II  TM Distance: >3 FB Neck ROM: full    Dental no notable dental hx.    Pulmonary neg pulmonary ROS, former smoker   Pulmonary exam normal breath sounds clear to auscultation       Cardiovascular Exercise Tolerance: Good hypertension, negative cardio ROS  Rhythm:regular Rate:Normal     Neuro/Psych  PSYCHIATRIC DISORDERS Anxiety  Bipolar Disorder   negative neurological ROS  negative psych ROS   GI/Hepatic negative GI ROS, Neg liver ROS,GERD  ,,  Endo/Other  negative endocrine ROSHypothyroidism    Renal/GU negative Renal ROS  negative genitourinary   Musculoskeletal   Abdominal   Peds  Hematology negative hematology ROS (+) Blood dyscrasia, anemia   Anesthesia Other Findings   Reproductive/Obstetrics negative OB ROS                             Anesthesia Physical Anesthesia Plan  ASA: 2  Anesthesia Plan: General   Post-op Pain Management:    Induction:   PONV Risk Score and Plan: Propofol infusion  Airway Management Planned:   Additional Equipment:   Intra-op Plan:   Post-operative Plan:   Informed Consent: I have reviewed the patients History and Physical, chart, labs and discussed the procedure including the risks, benefits and alternatives for the proposed anesthesia with the patient or authorized representative who has indicated his/her understanding and acceptance.     Dental Advisory Given  Plan Discussed with: CRNA  Anesthesia Plan Comments:        Anesthesia Quick Evaluation

## 2023-05-08 ENCOUNTER — Encounter (HOSPITAL_COMMUNITY): Payer: Self-pay | Admitting: Gastroenterology

## 2023-05-08 LAB — SURGICAL PATHOLOGY

## 2023-05-09 NOTE — Anesthesia Postprocedure Evaluation (Signed)
 Anesthesia Post Note  Patient: Joshua Barr  Procedure(s) Performed: ESOPHAGOGASTRODUODENOSCOPY (EGD) WITH PROPOFOL BIOPSY SAVORY DILATION  Anesthesia Type: General Anesthetic complications: no   There were no known notable events for this encounter.   Last Vitals:  Vitals:   05/07/23 0934 05/07/23 1140  BP: (!) 148/74 (!) 110/55  Pulse: (!) 46 63  Resp: 15 16  Temp: 36.7 C 36.8 C  SpO2: 100% 99%    Last Pain:  Vitals:   05/07/23 1140  TempSrc: Oral  PainSc: 0-No pain                 Windell Norfolk

## 2023-05-09 NOTE — Telephone Encounter (Signed)
 Esophagram scheduled for 05/16/23 at 9:00am at [facility]. Pt is to arrive 30 minutes early. Pt contacted and made aware of appointment.

## 2023-05-13 NOTE — Progress Notes (Signed)
 I reviewed the pathology results. Ann, can you send her a letter with the findings as described below please?  Thanks,  [name], MD Gastroenterology and Hepatology [facility]  ---------------------------------------------------------------------------------------------  [facility] [address], [place], [place] [address] Phone:  [phone]   05/13/23 [name], [place]   Dear Joshua Barr,  I am writing to inform you that the biopsies taken during your recent endoscopic examination showed:  No evidence of Barrett esophagus or EOEbut showed reflux esophagitis  I recommend continue taking omeprazole 40mg  before breakfast and follow up with [place] in GI clinic if you have symptoms  Also I value your feedback , so if you get a survey , please take the time to fill it out and thank you for choosing [facility]  Please call us at [phone] if you have persistent problems or have questions about your condition that have not been fully answered at this time.  Sincerely,  [name], MD Gastroenterology and Hepatology

## 2023-05-14 ENCOUNTER — Encounter (INDEPENDENT_AMBULATORY_CARE_PROVIDER_SITE_OTHER): Payer: Self-pay | Admitting: *Deleted

## 2023-05-16 ENCOUNTER — Ambulatory Visit (HOSPITAL_COMMUNITY)
Admission: RE | Admit: 2023-05-16 | Discharge: 2023-05-16 | Disposition: A | Discharge status: Home / Self Care | Payer: Medicare HMO | Source: Ambulatory Visit | Attending: Gastroenterology | Admitting: Gastroenterology

## 2023-05-16 DIAGNOSIS — R1319 Other dysphagia: Secondary | ICD-10-CM | POA: Diagnosis present

## 2023-05-20 ENCOUNTER — Encounter (INDEPENDENT_AMBULATORY_CARE_PROVIDER_SITE_OTHER): Payer: Self-pay | Admitting: Gastroenterology

## 2023-05-20 NOTE — Progress Notes (Signed)
 Esophagram   IMPRESSION: Mild dysmotility.  Otherwise normal barium study.
